# Patient Record
Sex: Male | Born: 1965
Health system: Southern US, Community
[De-identification: ages and names within clinical notes are randomized; demographics above are authoritative.]

## PROBLEM LIST (undated history)

## (undated) DIAGNOSIS — M1990 Unspecified osteoarthritis, unspecified site: Secondary | ICD-10-CM

## (undated) DIAGNOSIS — T7840XA Allergy, unspecified, initial encounter: Secondary | ICD-10-CM

## (undated) DIAGNOSIS — I10 Essential (primary) hypertension: Secondary | ICD-10-CM

## (undated) HISTORY — DX: Essential (primary) hypertension: I10

## (undated) HISTORY — DX: Unspecified osteoarthritis, unspecified site: M19.90

## (undated) HISTORY — DX: Allergy, unspecified, initial encounter: T78.40XA

---

## 2013-11-03 ENCOUNTER — Emergency Department: Payer: Self-pay | Admitting: Emergency Medicine

## 2015-07-04 ENCOUNTER — Ambulatory Visit: Payer: Self-pay | Admitting: Family Medicine

## 2015-07-12 ENCOUNTER — Ambulatory Visit (INDEPENDENT_AMBULATORY_CARE_PROVIDER_SITE_OTHER): Payer: Self-pay | Admitting: Family Medicine

## 2015-07-12 ENCOUNTER — Encounter: Payer: Self-pay | Admitting: Family Medicine

## 2015-07-12 ENCOUNTER — Ambulatory Visit: Payer: Self-pay | Admitting: Family Medicine

## 2015-07-12 VITALS — BP 140/98 | HR 89 | Temp 98.3°F | Resp 16 | Ht 68.0 in | Wt 180.0 lb

## 2015-07-12 DIAGNOSIS — E785 Hyperlipidemia, unspecified: Secondary | ICD-10-CM

## 2015-07-12 DIAGNOSIS — S39011A Strain of muscle, fascia and tendon of abdomen, initial encounter: Secondary | ICD-10-CM | POA: Diagnosis not present

## 2015-07-12 DIAGNOSIS — Z7189 Other specified counseling: Secondary | ICD-10-CM

## 2015-07-12 DIAGNOSIS — Z1211 Encounter for screening for malignant neoplasm of colon: Secondary | ICD-10-CM

## 2015-07-12 DIAGNOSIS — Z125 Encounter for screening for malignant neoplasm of prostate: Secondary | ICD-10-CM | POA: Diagnosis not present

## 2015-07-12 DIAGNOSIS — I10 Essential (primary) hypertension: Secondary | ICD-10-CM | POA: Insufficient documentation

## 2015-07-12 DIAGNOSIS — Z7689 Persons encountering health services in other specified circumstances: Secondary | ICD-10-CM

## 2015-07-12 DIAGNOSIS — Z720 Tobacco use: Secondary | ICD-10-CM

## 2015-07-12 NOTE — Assessment & Plan Note (Signed)
Previously diet controlled. Will check at home x2 weeks and plan to restart medication if still high. Discussed diet and lifestyle changes. Check CMET.

## 2015-07-12 NOTE — Patient Instructions (Signed)
I think you strained a muscle in your groin. I do not think there is a hernia present. Avoid straining and heavy lifting if possible for a few weeks. Use heat and ice as needed for pain. Take aleve or advil as needed for pain.  If the pain is not getting better we will get an ultrasound.   Check your blood pressure at work 2-3 times per week and write the number down.  I want to see you back in 2-3 weeks to check your blood pressure. Your goal blood pressure is 140/90. Work on low salt/sodium diet - goal <1.5gm (1,500mg ) per day. Eat a diet high in fruits/vegetables and whole grains.  Look into mediterranean and DASH diet. Goal activity is 13150min/wk of moderate intensity exercise.  This can be split into 30 minute chunks.  If you are not at this level, you can start with smaller 10-15 min increments and slowly build up activity. Look at www.heart.org for more resources Please seek immediate medical attention at ER or Urgent Care if you develop: Chest pain, pressure or tightness. Shortness of breath accompanied by nausea or diaphoresis Visual changes Numbness or tingling on one side of the body Facial droop Altered mental status Or any concerning symptoms.

## 2015-07-12 NOTE — Assessment & Plan Note (Signed)
Pt feels ready to quit. 5 minutes of smoking cessation counseling given today. Referred to [facility] for smoking cessation resources.

## 2015-07-12 NOTE — Progress Notes (Signed)
Subjective:    Patient ID: Ryan Bean, male    DOB: [birth year], 50 y.o.   MRN: [id number]  HPI: Ryan Bean is a 50 y.o. male presenting on 07/12/2015 for Establish Care   HPI  Pt presents to establish care today. Previous care provider was Dr. Lahoma RockerPancaldo.  It has been 4 years since His last PCP visit. Records from previous provider will be requested and reviewed. Current medical problems include:  High Blood Pressure: Diagnosed 4 years ago. Was previously on a diuretic. Diet controlled his BP. Has stated it was high past few times it was checked. No CP, SOB, or feeling faint. No swelling in feet or legs.  Arthritis: R elbow- managed OTC tylenol and aleve. Occasionally bothers him. Allergies: Seasonal. Pollen. Takes OTC zyrtec as needed. Does not use flonase.  Groin pain when he exercises- does lots of crunches. Feels pressure in groin when he raises up. Has not noticed a lump or a bulge. Symptoms present x 1 month. Only painful when he does push-ups.  Health maintenance:  No colonoscopy- as yet. Does not want. Desires cologuard. No family history of prostate. Desires to PSA.  Flu shot- declines.  Current smoker- 1 pack per 3 days. Wants to quit. Feels ready.    Past Medical History  Diagnosis Date  . Allergy   . Arthritis   . Hypertension    Social History   Social History  . Marital Status: Single    Spouse Name: N/A  . Number of Children: N/A  . Years of Education: N/A   Occupational History  . Not on file.   Social History Main Topics  . Smoking status: Current Every Day Smoker -- 1.00 packs/day  . Smokeless tobacco: Not on file     Comment: pt smokes pack in 3 days  . Alcohol Use: 1.2 oz/week    2 Cans of beer per week  . Drug Use: No  . Sexual Activity: Not on file   Other Topics Concern  . Not on file   Social History Narrative  . No narrative on file   No family history on file. No current outpatient prescriptions on file prior to  visit.   No current facility-administered medications on file prior to visit.    Review of Systems  Constitutional: Negative for fever and chills.  HENT: Positive for postnasal drip and rhinorrhea.   Eyes: Positive for itching (allergy related).  Respiratory: Negative for chest tightness, shortness of breath and wheezing.   Cardiovascular: Negative for chest pain, palpitations and leg swelling.  Gastrointestinal: Negative for nausea, vomiting and abdominal pain.  Endocrine: Negative.   Genitourinary: Negative for dysuria, urgency, discharge, penile pain and testicular pain.  Musculoskeletal: Negative for back pain, joint swelling and arthralgias.       Groin pain with straining.  Skin: Negative.   Allergic/Immunologic: Positive for environmental allergies.  Neurological: Negative for dizziness, weakness, numbness and headaches.  Psychiatric/Behavioral: Negative for sleep disturbance and dysphoric mood.   Per HPI unless specifically indicated above     Objective:    BP 140/98 mmHg  Pulse 89  Temp(Src) 98.3 F (36.8 C) (Oral)  Resp 16  Ht 5\' 8"  (1.727 m)  Wt 180 lb (81.647 kg)  BMI 27.38 kg/m2  Wt Readings from Last 3 Encounters:  07/12/15 180 lb (81.647 kg)    Physical Exam  Constitutional: He is oriented to person, place, and time. He appears well-developed and well-nourished. No distress.  HENT:  Head:  Normocephalic and atraumatic.  Neck: Neck supple. No thyromegaly present.  Cardiovascular: Normal rate, regular rhythm and normal heart sounds.  Exam reveals no gallop and no friction rub.   No murmur heard. Pulmonary/Chest: Effort normal and breath sounds normal. He has no wheezes.  Abdominal: Soft. Bowel sounds are normal. He exhibits no distension. There is no tenderness. There is no rebound. Hernia confirmed negative in the right inguinal area and confirmed negative in the left inguinal area.  Genitourinary: Penis normal.  Musculoskeletal: Normal range of motion. He  exhibits no edema or tenderness.  Lymphadenopathy:       Right: No inguinal adenopathy present.       Left: No inguinal adenopathy present.  Neurological: He is alert and oriented to person, place, and time. He has normal reflexes.  Skin: Skin is warm and dry. No rash noted. No erythema.  Psychiatric: He has a normal mood and affect. His behavior is normal. Thought content normal.   No results found for this or any previous visit.    Assessment & Plan:   Problem List Items Addressed This Visit      Cardiovascular and Mediastinum   HTN (hypertension)    Previously diet controlled. Will check at home x2 weeks and plan to restart medication if still high. Discussed diet and lifestyle changes. Check CMET.       Relevant Medications   omega-3 acid ethyl esters (LOVAZA) 1 g capsule   Other Relevant Orders   Comprehensive Metabolic Panel (CMET)     Other   Current tobacco use    Pt feels ready to quit. 5 minutes of smoking cessation counseling given today. Referred to  Quitline for smoking cessation resources.        Other Visit Diagnoses    Encounter to establish care    -  Primary    Mild hyperlipidemia        Told in the past. Recheck lipids today.     Relevant Medications    omega-3 acid ethyl esters (LOVAZA) 1 g capsule    Other Relevant Orders    Lipid Profile    Prostate cancer screening        Reviewed risks and benefits. Pt would like to screen PSA.     Relevant Orders    PSA    Colon cancer screening        Pt declined colonoscopy. Has consented to do cologuard.     Relevant Orders    Cologuard    Strain of groin, initial encounter        Hernia tests negative. Likley MSK strain. Aleve and rest. If symptoms still present, consider [place] to r/o hernia.        Meds ordered this encounter  Medications  . omega-3 acid ethyl esters (LOVAZA) 1 g capsule    Sig: Take by mouth daily. Pt takes 350 mg Mega Red fish oil      Follow up plan: Return in about 3 weeks  (around 08/02/2015) for BP check.

## 2015-07-14 LAB — LIPID PANEL
CHOLESTEROL TOTAL: 249 mg/dL — AB (ref 100–199)
Chol/HDL Ratio: 8 ratio units — ABNORMAL HIGH (ref 0.0–5.0)
HDL: 31 mg/dL — ABNORMAL LOW (ref >39)
Triglycerides: 1461 mg/dL (ref 0–149)

## 2015-07-14 LAB — COMPREHENSIVE METABOLIC PANEL
ALBUMIN: 4.3 g/dL (ref 3.5–5.5)
ALK PHOS: 73 IU/L (ref 39–117)
ALT: 70 IU/L — AB (ref 0–44)
AST: 38 IU/L (ref 0–40)
Albumin/Globulin Ratio: 1.9 (ref 1.2–2.2)
BILIRUBIN TOTAL: 0.4 mg/dL (ref 0.0–1.2)
BUN / CREAT RATIO: 25 — AB (ref 9–20)
BUN: 17 mg/dL (ref 6–24)
CHLORIDE: 99 mmol/L (ref 96–106)
CO2: 23 mmol/L (ref 18–29)
Calcium: 9.2 mg/dL (ref 8.7–10.2)
Creatinine, Ser: 0.68 mg/dL — ABNORMAL LOW (ref 0.76–1.27)
GFR calc Af Amer: 129 mL/min/{1.73_m2} (ref >59)
GFR calc non Af Amer: 111 mL/min/{1.73_m2} (ref >59)
GLUCOSE: 94 mg/dL (ref 65–99)
Globulin, Total: 2.3 g/dL (ref 1.5–4.5)
POTASSIUM: 4 mmol/L (ref 3.5–5.2)
Sodium: 137 mmol/L (ref 134–144)
Total Protein: 6.6 g/dL (ref 6.0–8.5)

## 2015-07-14 LAB — PSA: Prostate Specific Ag, Serum: 0.5 ng/mL (ref 0.0–4.0)

## 2015-07-17 ENCOUNTER — Other Ambulatory Visit: Payer: Self-pay | Admitting: Family Medicine

## 2015-07-17 DIAGNOSIS — E782 Mixed hyperlipidemia: Secondary | ICD-10-CM

## 2015-08-02 ENCOUNTER — Ambulatory Visit: Payer: BLUE CROSS/BLUE SHIELD | Admitting: Family Medicine

## 2015-08-03 ENCOUNTER — Ambulatory Visit (INDEPENDENT_AMBULATORY_CARE_PROVIDER_SITE_OTHER): Payer: BLUE CROSS/BLUE SHIELD | Admitting: Family Medicine

## 2015-08-03 ENCOUNTER — Telehealth: Payer: Self-pay | Admitting: *Deleted

## 2015-08-03 VITALS — BP 144/94 | HR 92 | Temp 98.4°F | Resp 16 | Ht 68.0 in | Wt 178.0 lb

## 2015-08-03 DIAGNOSIS — I10 Essential (primary) hypertension: Secondary | ICD-10-CM

## 2015-08-03 DIAGNOSIS — R1032 Left lower quadrant pain: Secondary | ICD-10-CM

## 2015-08-03 DIAGNOSIS — K625 Hemorrhage of anus and rectum: Secondary | ICD-10-CM | POA: Diagnosis not present

## 2015-08-03 DIAGNOSIS — R42 Dizziness and giddiness: Secondary | ICD-10-CM

## 2015-08-03 DIAGNOSIS — E782 Mixed hyperlipidemia: Secondary | ICD-10-CM | POA: Diagnosis not present

## 2015-08-03 DIAGNOSIS — E7849 Other hyperlipidemia: Secondary | ICD-10-CM | POA: Insufficient documentation

## 2015-08-03 DIAGNOSIS — Z1211 Encounter for screening for malignant neoplasm of colon: Secondary | ICD-10-CM

## 2015-08-03 DIAGNOSIS — R1030 Lower abdominal pain, unspecified: Secondary | ICD-10-CM

## 2015-08-03 MED ORDER — HYDROCHLOROTHIAZIDE 12.5 MG PO TABS: 12.5000 mg | ORAL_TABLET | Freq: Every day | ORAL | Status: DC

## 2015-08-03 NOTE — Progress Notes (Signed)
Subjective:    Patient ID: Ryan Bean, male    DOB: [birth year], 50 y.o.   MRN: [id number]  HPI: Ryan Bean is a 50 y.o. male presenting on 08/03/2015 for Hypertension   HPI   Here for BP check up. BP remains elevated.150-160's/100's when he's checked at home. Just had fasting lipid panel drawn. Had an episode of lightheadedness with pre-syncope. This happened once 2 weeks ago after doing a lot of yardwork. Did not drink a lot o of water that day. Resolved.  Also had one occurrence of left arm "pressure", but he was able to move. No numbness or tingling. This happened 2 weeks ago. Could have been related to working in the yard he says. He is ambidextrous. No HA. No CP/SOB. No swelling.  He was on BP meds in the past, but taken off once he lost weight. Believes it was HCTZ. Still smokes, but has cut down. Yesterday smoked only 2 cigarettes.   Groin pain on left side is slightly better, but still there - would like [place].  Cologuard - plans on turning in next week. Had some blood in stool he thought was related to hemorrhoids and read that he should not donate stool with blood. Will refer for colonoscopy   Past Medical History  Diagnosis Date  . Allergy   . Arthritis   . Hypertension     Current Outpatient Prescriptions on File Prior to Visit  Medication Sig  . omega-3 acid ethyl esters (LOVAZA) 1 g capsule Take by mouth daily. Pt takes 350 mg Mega Red fish oil   No current facility-administered medications on file prior to visit.    Review of Systems  Constitutional: Negative for activity change and appetite change.  HENT: Negative for congestion and hearing loss.   Eyes: Negative for visual disturbance.  Respiratory: Negative for cough, chest tightness and shortness of breath.   Cardiovascular: Negative for chest pain and leg swelling.  Gastrointestinal: Positive for anal bleeding. Negative for abdominal pain and abdominal distention.  Genitourinary:  Negative for difficulty urinating.  Musculoskeletal: Negative for myalgias and arthralgias.  Skin: Negative for color change.  Neurological: Positive for dizziness and light-headedness. Negative for syncope, weakness and headaches.  Psychiatric/Behavioral: Negative for behavioral problems, sleep disturbance and agitation.   Per HPI unless specifically indicated above     Objective:    BP 144/94 mmHg  Pulse 92  Temp(Src) 98.4 F (36.9 C) (Oral)  Resp 16  Ht  (1.727 m)  Wt 178 lb (80.74 kg)  BMI 27.07 kg/m2  Wt Readings from Last 3 Encounters:  08/03/15 178 lb (80.74 kg)  07/12/15 180 lb (81.647 kg)    Physical Exam  Constitutional: He is oriented to person, place, and time. He appears well-developed and well-nourished.  HENT:  Head: Normocephalic.  Eyes: EOM are normal. Pupils are equal, round, and reactive to light.  Neck: Normal range of motion.  Cardiovascular: Normal rate, regular rhythm, normal heart sounds and normal pulses.   Pulmonary/Chest: Effort normal and breath sounds normal.  Musculoskeletal: Normal range of motion.  Normal strength in BUE and BLE.  Neurological: He is alert and oriented to person, place, and time. He has normal strength. No cranial nerve deficit or sensory deficit. Coordination and gait normal. GCS eye subscore is 4. GCS verbal subscore is 5. GCS motor subscore is 6.  Reflex Scores:      Patellar reflexes are 2+ on the right side and 2+ on the left side.  Cerebellar movements intact.  Skin: Skin is warm and dry.  Psychiatric: He has a normal mood and affect. His behavior is normal.   Results for orders placed or performed in visit on 07/12/15  Comprehensive Metabolic Panel (CMET)  Result Value Ref Range   Glucose 94 65 - 99 mg/dL   BUN 17 6 - 24 mg/dL   Creatinine, Ser 4.090.68 (L) 0.76 - 1.27 mg/dL   GFR calc non Af Amer 111 >59 mL/min/1.73   GFR calc Af Amer 129 >59 mL/min/1.73   BUN/Creatinine Ratio 25 (H) 9 - 20   Sodium 137 134 -  144 mmol/L   Potassium 4.0 3.5 - 5.2 mmol/L   Chloride 99 96 - 106 mmol/L   CO2 23 18 - 29 mmol/L   Calcium 9.2 8.7 - 10.2 mg/dL   Total Protein 6.6 6.0 - 8.5 g/dL   Albumin 4.3 3.5 - 5.5 g/dL   Globulin, Total 2.3 1.5 - 4.5 g/dL   Albumin/Globulin Ratio 1.9 1.2 - 2.2   Bilirubin Total 0.4 0.0 - 1.2 mg/dL   Alkaline Phosphatase 73 39 - 117 IU/L   AST 38 0 - 40 IU/L   ALT 70 (H) 0 - 44 IU/L  Lipid Profile  Result Value Ref Range   Cholesterol, Total 249 (H) 100 - 199 mg/dL   Triglycerides 81191461 (HH) 0 - 149 mg/dL   HDL 31 (L) >14>39 mg/dL   VLDL Cholesterol Cal Comment 5 - 40 mg/dL   LDL Calculated Comment 0 - 99 mg/dL   Chol/HDL Ratio 8.0 (H) 0.0 - 5.0 ratio units  PSA  Result Value Ref Range   Prostate Specific Ag, Serum 0.5 0.0 - 4.0 ng/mL      Assessment & Plan:   Problem List Items Addressed This Visit      Cardiovascular and Mediastinum   HTN (hypertension)    Start on HCTZ today. Reviewed need to remain hydrated. Encouraged pt to check BP at home. Encouraged DASH diet and smoking cessation. Return in 4 weeks.       Relevant Medications   hydrochlorothiazide (HYDRODIURIL) 12.5 MG tablet     Other   Elevated triglycerides with high cholesterol    Recheck fasting lipid panel.       Relevant Medications   hydrochlorothiazide (HYDRODIURIL) 12.5 MG tablet    Other Visit Diagnoses    Lightheadedness    -  Primary    ECG to r/o cardiac cause.ECG WNL. Check CBC. Likley 2/2 dehydration.  Counseled on maintaining hydration when working in yard. Pt to ER if it occurs again.     Relevant Orders    EKG 12-Lead    CBC with Differential/Platelet    TSH    Left groin pain        R/o hernia through US. Continue PRN aleve for pain.     Relevant Orders    US Extrem Low Left Ltd    Screening for colon cancer        Screen with colonoscopy considering recent rectal bleeding.     Relevant Orders    Ambulatory referral to General Surgery    Rectal bleeding           Meds  ordered this encounter  Medications  . hydrochlorothiazide (HYDRODIURIL) 12.5 MG tablet    Sig: Take 1 tablet (12.5 mg total) by mouth daily.    Dispense:  30 tablet    Refill:  11    Order Specific Question:  Supervising Provider  Answer:  Janeann Forehand [[id number]]      Follow up plan: Return in about 4 weeks (around 08/31/2015), or if symptoms worsen or fail to improve, for Blood pressure. Marland Kitchen

## 2015-08-03 NOTE — Addendum Note (Signed)
Addended by: Elvina MattesPATEL, Cheyenne Schumm D on: 08/03/2015 04:34 PM   Modules accepted: Kipp BroodSmartSet

## 2015-08-03 NOTE — Patient Instructions (Signed)
Lightheadedness: I think your symptoms were related to dehydration or not eating since they occurred on a day when you were outside for a long period of time and working. However if they occur in the future- please seek medical attention.  We will schedule and ultrasound to rule out a hernia in your groin.   Your goal blood pressure is 140/90. Work on low salt/sodium diet - goal <1.5gm (1,500mg ) per day. Eat a diet high in fruits/vegetables and whole grains.  Look into mediterranean and DASH diet. Goal activity is 12750min/wk of moderate intensity exercise.  This can be split into 30 minute chunks.  If you are not at this level, you can start with smaller 10-15 min increments and slowly build up activity. Look at www.heart.org for more resources  Please seek immediate medical attention at ER or Urgent Care if you develop: Chest pain, pressure or tightness. Shortness of breath accompanied by nausea or diaphoresis Visual changes Numbness or tingling on one side of the body Facial droop Altered mental status Or any concerning symptoms.

## 2015-08-03 NOTE — Assessment & Plan Note (Signed)
Recheck fasting lipid panel 

## 2015-08-03 NOTE — Telephone Encounter (Signed)
Patient scheduled for u/s on 08/07/15 arrival 3:45 for 4 pm appt. [facility] [address]

## 2015-08-03 NOTE — Assessment & Plan Note (Signed)
Start on HCTZ today. Reviewed need to remain hydrated. Encouraged pt to check BP at home. Encouraged DASH diet and smoking cessation. Return in 4 weeks.

## 2015-08-04 LAB — LIPID PANEL
CHOL/HDL RATIO: 7.2 ratio — AB (ref 0.0–5.0)
Cholesterol, Total: 229 mg/dL — ABNORMAL HIGH (ref 100–199)
HDL: 32 mg/dL — AB (ref >39)
TRIGLYCERIDES: 720 mg/dL — AB (ref 0–149)

## 2015-08-04 LAB — PSA: PROSTATE SPECIFIC AG, SERUM: 0.5 ng/mL (ref 0.0–4.0)

## 2015-08-07 ENCOUNTER — Other Ambulatory Visit: Payer: Self-pay | Admitting: Family Medicine

## 2015-08-07 ENCOUNTER — Ambulatory Visit: Payer: BLUE CROSS/BLUE SHIELD

## 2015-08-07 DIAGNOSIS — E781 Pure hyperglyceridemia: Secondary | ICD-10-CM

## 2015-08-07 MED ORDER — GEMFIBROZIL 600 MG PO TABS: 600.0000 mg | ORAL_TABLET | Freq: Two times a day (BID) | ORAL | Status: DC

## 2015-08-16 ENCOUNTER — Ambulatory Visit: Payer: BLUE CROSS/BLUE SHIELD

## 2015-09-05 ENCOUNTER — Ambulatory Visit: Payer: BLUE CROSS/BLUE SHIELD | Admitting: Family Medicine

## 2015-09-05 ENCOUNTER — Encounter: Payer: Self-pay | Admitting: Family Medicine

## 2015-09-13 ENCOUNTER — Ambulatory Visit (INDEPENDENT_AMBULATORY_CARE_PROVIDER_SITE_OTHER): Payer: BLUE CROSS/BLUE SHIELD | Admitting: Family Medicine

## 2015-09-13 ENCOUNTER — Encounter: Payer: Self-pay | Admitting: Family Medicine

## 2015-09-13 VITALS — BP 136/82 | HR 87 | Temp 98.6°F | Resp 16 | Ht 68.0 in | Wt 174.0 lb

## 2015-09-13 DIAGNOSIS — E782 Mixed hyperlipidemia: Secondary | ICD-10-CM | POA: Diagnosis not present

## 2015-09-13 DIAGNOSIS — Z720 Tobacco use: Secondary | ICD-10-CM

## 2015-09-13 DIAGNOSIS — I10 Essential (primary) hypertension: Secondary | ICD-10-CM | POA: Diagnosis not present

## 2015-09-13 NOTE — Assessment & Plan Note (Signed)
Pt down to 3 cigarettes per day. Encouraged him to quit when he finishes last pack.

## 2015-09-13 NOTE — Assessment & Plan Note (Signed)
Controlled with HCTZ. Recheck in 2 mos. Plan to recheck BMET at that time. Encouraged smoking cessation. Reviewed DASH diet. Alarm symptoms reviewed.

## 2015-09-13 NOTE — Progress Notes (Signed)
Subjective:    Patient ID: Ryan Bean, male    DOB: May 09, 1965, 50 y.o.   MRN: [id number]  HPI: Ryan Bean is a 50 y.o. male presenting on 09/13/2015 for Hypertension   HPI  Pt present for blood pressure follow-up. Started HCTZ 12.mg at last visit. Currently smoking. BP is a little high. 3 cigarettes per day. No HA or chest pain. No SOB. No visual changes. Started lopid last month. Doing.   Past Medical History  Diagnosis Date  . Allergy   . Arthritis   . Hypertension     Current Outpatient Prescriptions on File Prior to Visit  Medication Sig  . gemfibrozil (LOPID) 600 MG tablet Take 1 tablet (600 mg total) by mouth 2 (two) times daily before a meal.  . hydrochlorothiazide (HYDRODIURIL) 12.5 MG tablet Take 1 tablet (12.5 mg total) by mouth daily.  Marland Kitchen. omega-3 acid ethyl esters (LOVAZA) 1 g capsule Take by mouth daily. Pt takes 350 mg Mega Red fish oil   No current facility-administered medications on file prior to visit.    Review of Systems  Constitutional: Negative for fever and chills.  HENT: Negative.   Respiratory: Negative for chest tightness, shortness of breath and wheezing.   Cardiovascular: Negative for chest pain, palpitations and leg swelling.  Gastrointestinal: Negative for nausea, vomiting and abdominal pain.  Endocrine: Negative.   Genitourinary: Negative for dysuria, urgency, discharge, penile pain and testicular pain.  Musculoskeletal: Negative for back pain, joint swelling and arthralgias.  Skin: Negative.   Neurological: Negative for dizziness, weakness, numbness and headaches.  Psychiatric/Behavioral: Negative for sleep disturbance and dysphoric mood.   Per HPI unless specifically indicated above     Objective:    BP 136/82 mmHg  Pulse 87  Temp(Src) 98.6 F (37 C) (Oral)  Resp 16  Ht 5\' 8"  (1.727 m)  Wt 174 lb (78.926 kg)  BMI 26.46 kg/m2  Wt Readings from Last 3 Encounters:  09/13/15 174 lb (78.926 kg)  08/03/15 178 lb  (80.74 kg)  07/12/15 180 lb (81.647 kg)    Physical Exam  Constitutional: He is oriented to person, place, and time. He appears well-developed and well-nourished. No distress.  HENT:  Head: Normocephalic and atraumatic.  Neck: Neck supple. No thyromegaly present.  Cardiovascular: Normal rate, regular rhythm and normal heart sounds.  Exam reveals no gallop and no friction rub.   No murmur heard. Pulmonary/Chest: Effort normal and breath sounds normal. He has no wheezes.  Abdominal: Soft. Bowel sounds are normal. He exhibits no distension. There is no tenderness. There is no rebound.  Musculoskeletal: Normal range of motion. He exhibits no edema or tenderness.  Neurological: He is alert and oriented to person, place, and time. He has normal reflexes.  Skin: Skin is warm and dry. No rash noted. No erythema.  Psychiatric: He has a normal mood and affect. His behavior is normal. Thought content normal.   Results for orders placed or performed in visit on 07/17/15  Lipid panel  Result Value Ref Range   Cholesterol, Total 229 (H) 100 - 199 mg/dL   Triglycerides 914720 (HH) 0 - 149 mg/dL   HDL 32 (L) >78>39 mg/dL   VLDL Cholesterol Cal Comment 5 - 40 mg/dL   LDL Calculated Comment 0 - 99 mg/dL   Chol/HDL Ratio 7.2 (H) 0.0 - 5.0 ratio units  PSA  Result Value Ref Range   Prostate Specific Ag, Serum 0.5 0.0 - 4.0 ng/mL      Assessment &  Plan:   Problem List Items Addressed This Visit      Cardiovascular and Mediastinum   HTN (hypertension)    Controlled with HCTZ. Recheck in 2 mos. Plan to recheck BMET at that time. Encouraged smoking cessation. Reviewed DASH diet. Alarm symptoms reviewed.         Other   Current tobacco use - Primary    Pt down to 3 cigarettes per day. Encouraged him to quit when he finishes last pack.       Elevated triglycerides with high cholesterol    Tolerating lopid. Will recheck TG's at next visit in 2 mos.          No orders of the defined types were  placed in this encounter.      Follow up plan: Return in about 2 months (around 11/13/2015) for HTN, and cholesterol. Marland Kitchen

## 2015-09-13 NOTE — Assessment & Plan Note (Signed)
Tolerating lopid. Will recheck TG's at next visit in 2 mos.

## 2015-09-13 NOTE — Patient Instructions (Addendum)
Your goal blood pressure is 140/90. Work on low salt/sodium diet - goal <1.5gm (1,500mg ) per day. Eat a diet high in fruits/vegetables and whole grains.  Look into mediterranean and DASH diet. Goal activity is 14450min/wk of moderate intensity exercise.  This can be split into 30 minute chunks.  If you are not at this level, you can start with smaller 10-15 min increments and slowly build up activity. Look at www.heart.org for more resources  Please seek immediate medical attention at ER or Urgent Care if you develop: Chest pain, pressure or tightness. Shortness of breath accompanied by nausea or diaphoresis Visual changes Numbness or tingling on one side of the body Facial droop Altered mental status Or any concerning symptoms.  [facility] [employer] [facility]  [facility] [address], [place], [place], [phone]

## 2015-11-09 ENCOUNTER — Ambulatory Visit: Payer: BLUE CROSS/BLUE SHIELD | Admitting: Family Medicine

## 2015-11-16 ENCOUNTER — Ambulatory Visit: Payer: BLUE CROSS/BLUE SHIELD | Admitting: Family Medicine

## 2015-11-24 IMAGING — CR RIGHT ELBOW - COMPLETE 3+ VIEW
1 series · 4 of 4 positions shown · non-contrast
Comparison: None.

CLINICAL DATA: Elbow pain and swelling following injury.

EXAM:
RIGHT ELBOW - COMPLETE 3+ VIEW

[Series 1: lat · 0.17mm/px · 4 of 4 slices shown]
[im 1/4]
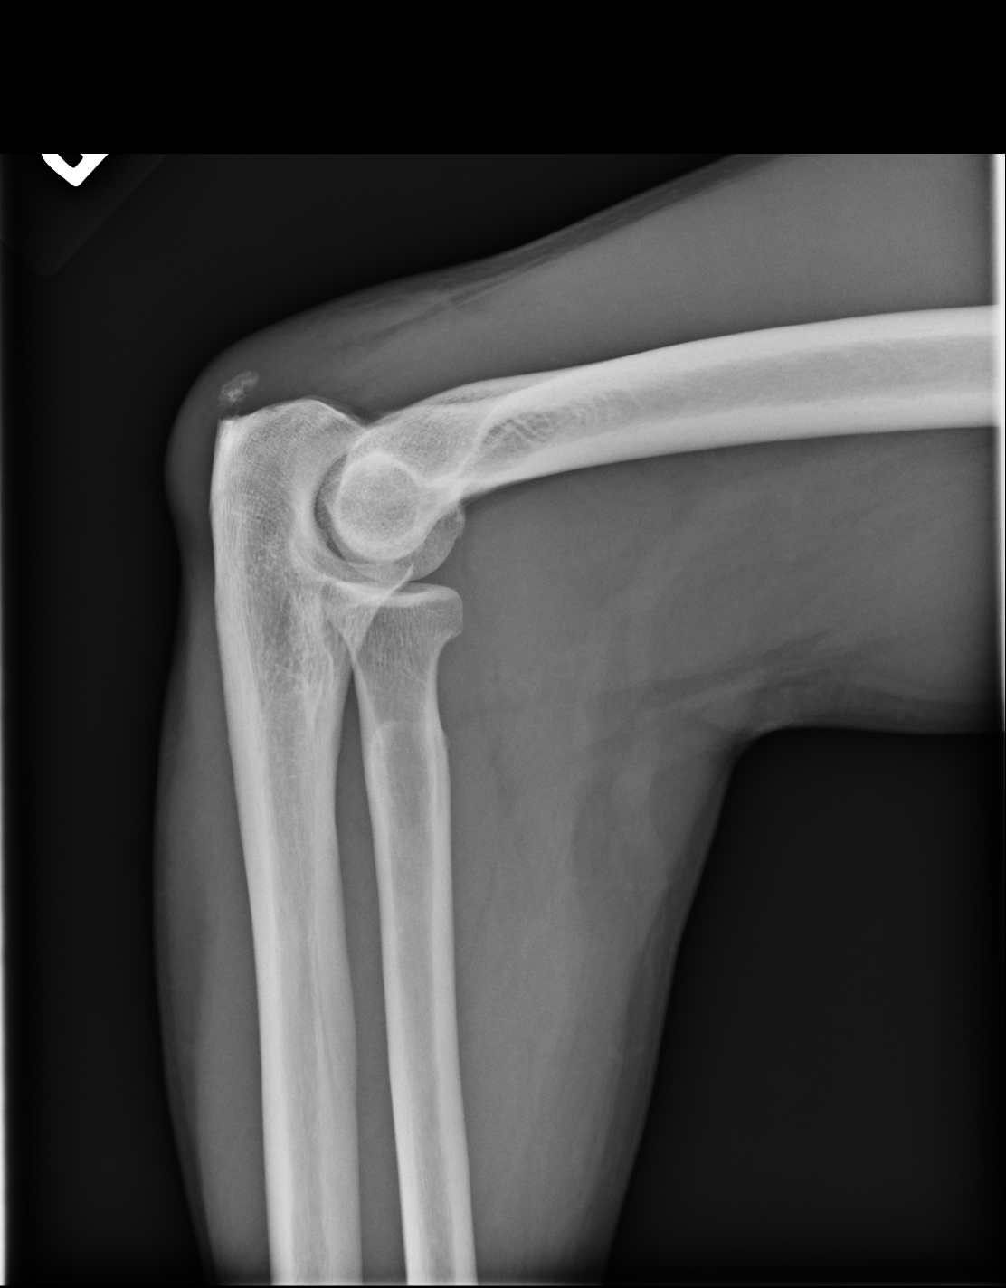
[im 2/4]
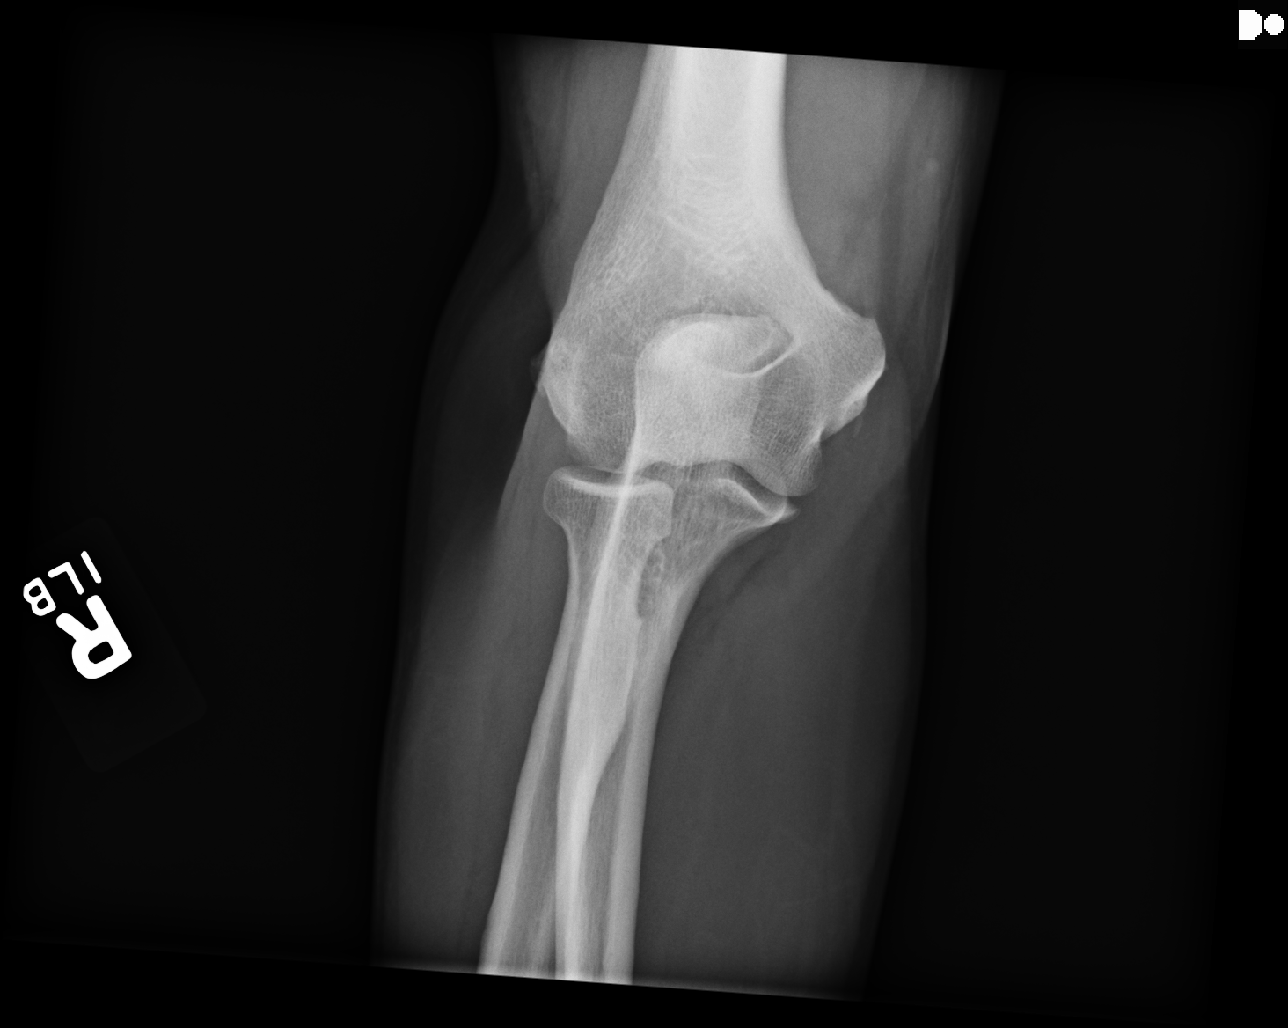
[im 3/4]
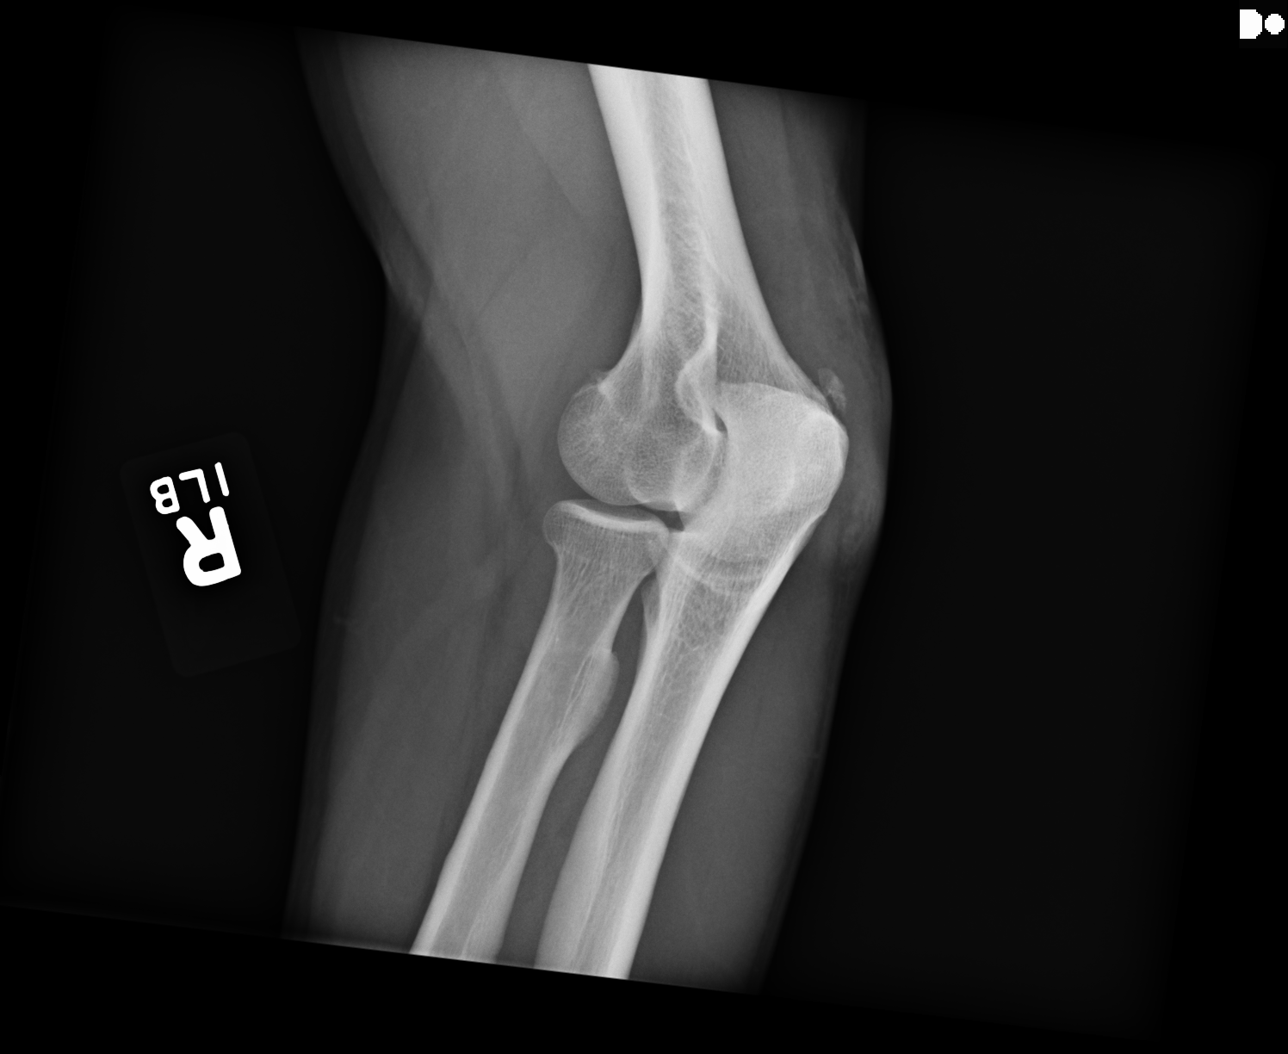
[im 4/4]
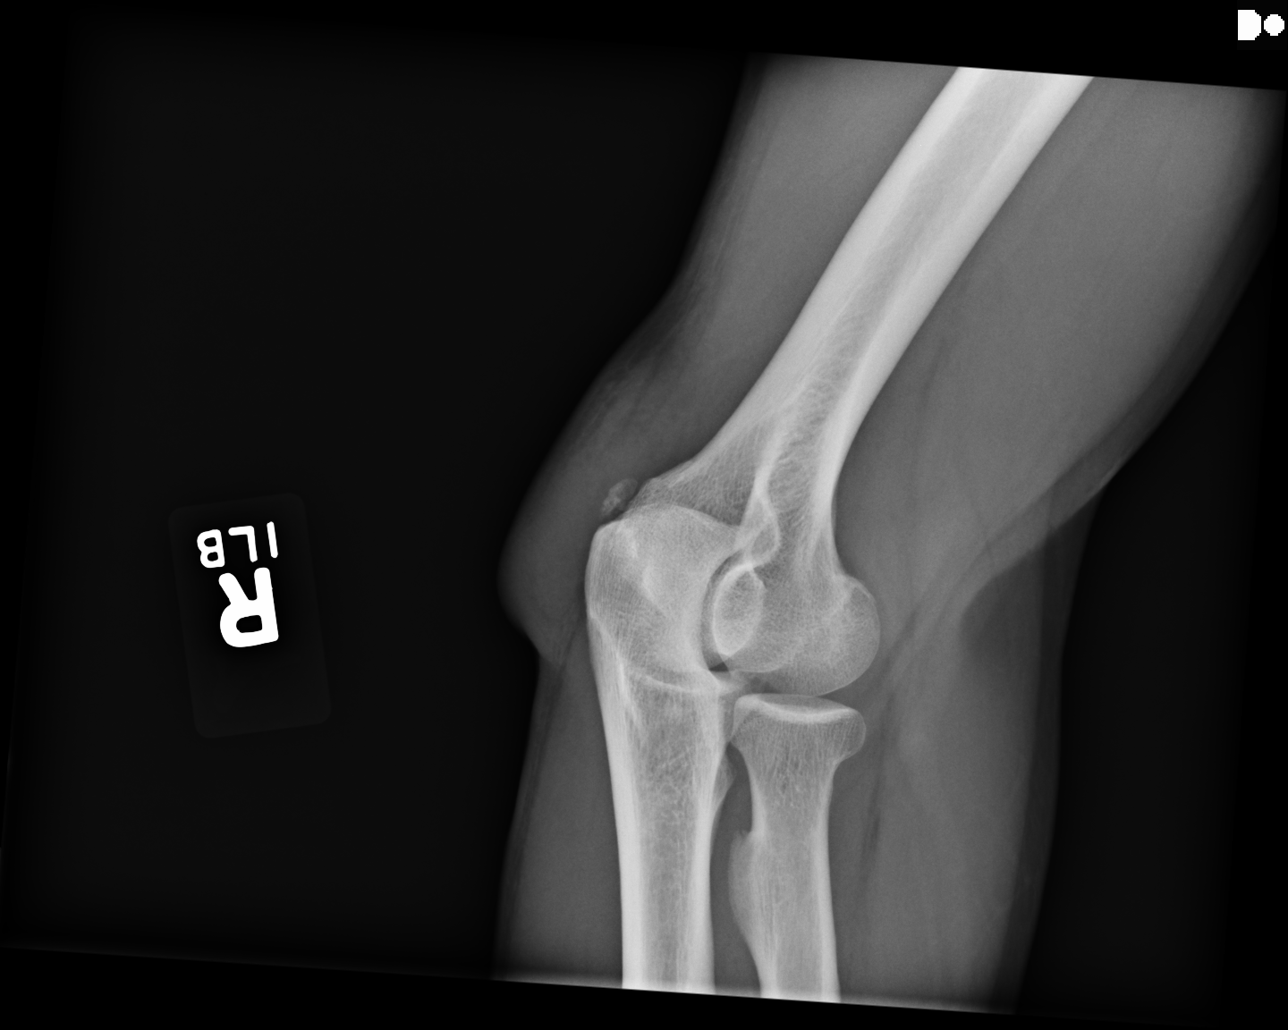

[4 of 4 positions shown; findings below may reference images not displayed]

FINDINGS: The mineralization and alignment are normal. There is prominent
spurring for age involving the olecranon process and both humeral
epicondyles. There is soft tissue swelling over the olecranon
process. An underlying ossific density is most likely a fragmented
spur, and no retraction of the triceps tendon is identified. There
is no evidence of acute fracture, dislocation or elbow joint
effusion.
IMPRESSION: Soft tissue swelling over the olecranon consistent with olecranon
bursitis. There is probable underlying fragmented spurring of the
olecranon. Acute avulsion of the triceps tendon is felt to be
unlikely but requires clinical correlation.

## 2016-02-29 ENCOUNTER — Telehealth: Payer: Self-pay | Admitting: Family Medicine

## 2016-02-29 DIAGNOSIS — I10 Essential (primary) hypertension: Secondary | ICD-10-CM

## 2016-02-29 MED ORDER — HYDROCHLOROTHIAZIDE 12.5 MG PO TABS: 12.5000 mg | ORAL_TABLET | Freq: Every day | ORAL | 11 refills | Status: DC

## 2016-02-29 NOTE — Telephone Encounter (Signed)
HCTZ refill sent to [facility] (not to [facility] [place] [facility])

## 2016-02-29 NOTE — Telephone Encounter (Signed)
Pt needs a refill on hydrochlorothiazide 12.5 mg sent to [facility].  His call back number is [phone]

## 2016-03-12 ENCOUNTER — Telehealth: Payer: Self-pay | Admitting: Family Medicine

## 2016-03-12 NOTE — Telephone Encounter (Signed)
Reviewed chart, I have already submitted refill for HCTZ 12.5mg  daily (#30 pills with +11 refills) on 02/29/16, and he should still have active rx for Gemfibrozil 600mg  twice a day #60 pills (+11 refills) that Amy Krebs sent in back in 07/2015 but should still be active.  Please have patient contact [facility]. If he wants these changed to [facility], then please let me know. Thanks.  Saralyn PilarAlexander Eliezer Khawaja, DO [facility] 03/12/2016, 2:15 PM

## 2016-03-12 NOTE — Telephone Encounter (Signed)
Pt needs refills on gemfibrozil 600 mg and hydrochlorothiazide 12.[facility] mg [facility] [place]  He has appt schedule 12/4.  His call back number is [phone]

## 2016-03-25 ENCOUNTER — Ambulatory Visit: Payer: BLUE CROSS/BLUE SHIELD | Admitting: Family Medicine

## 2016-06-06 ENCOUNTER — Ambulatory Visit (INDEPENDENT_AMBULATORY_CARE_PROVIDER_SITE_OTHER): Payer: BLUE CROSS/BLUE SHIELD | Admitting: Family Medicine

## 2016-06-06 ENCOUNTER — Encounter: Payer: Self-pay | Admitting: Family Medicine

## 2016-06-06 VITALS — BP 144/90 | HR 85 | Temp 97.8°F | Resp 16 | Ht 68.0 in | Wt 186.0 lb

## 2016-06-06 DIAGNOSIS — E782 Mixed hyperlipidemia: Secondary | ICD-10-CM

## 2016-06-06 DIAGNOSIS — Z720 Tobacco use: Secondary | ICD-10-CM

## 2016-06-06 DIAGNOSIS — I10 Essential (primary) hypertension: Secondary | ICD-10-CM | POA: Diagnosis not present

## 2016-06-06 DIAGNOSIS — Z716 Tobacco abuse counseling: Secondary | ICD-10-CM | POA: Diagnosis not present

## 2016-06-06 MED ORDER — HYDROCHLOROTHIAZIDE 12.5 MG PO TABS: 12.5000 mg | ORAL_TABLET | Freq: Every day | ORAL | 0 refills | Status: DC

## 2016-06-06 MED ORDER — HYDROCHLOROTHIAZIDE 12.5 MG PO TABS: 12.5000 mg | ORAL_TABLET | Freq: Every day | ORAL | 11 refills | Status: DC

## 2016-06-06 MED ORDER — OMEGA-3-ACID ETHYL ESTERS 1 G PO CAPS: 1.0000 g | ORAL_CAPSULE | Freq: Two times a day (BID) | ORAL | 0 refills | Status: DC

## 2016-06-06 MED ORDER — OMEGA-3-ACID ETHYL ESTERS 1 G PO CAPS: 1.0000 g | ORAL_CAPSULE | Freq: Two times a day (BID) | ORAL | 11 refills | Status: DC

## 2016-06-06 MED ORDER — GEMFIBROZIL 600 MG PO TABS: 600.0000 mg | ORAL_TABLET | Freq: Two times a day (BID) | ORAL | 0 refills | Status: DC

## 2016-06-06 MED ORDER — GEMFIBROZIL 600 MG PO TABS: 600.0000 mg | ORAL_TABLET | Freq: Two times a day (BID) | ORAL | 11 refills | Status: DC

## 2016-06-06 NOTE — Patient Instructions (Signed)
Thank you for coming in to clinic today.  1. BP is elevated, likely due to not taking med today - [facility], 0 refills  Take printed rx all to [facility] for 1 month supply +11 refills, so 1 year total  2. Check BP at work once weekly, write down readings, for next 8 weeks  Start OTC baby aspirin 81mg  daily to help reduce risk of heart attack and stroke   Let me know if interested in quitting smoking, can discuss medications and other treatment  [phone]  You will be due for FASTING BLOOD WORK (no food or drink after midnight before, only water or coffee without cream/sugar on the morning of)  - Please go ahead and schedule a "Lab Only" visit in the morning at the clinic for lab draw in  2 months AFTER 08/02/16 - Make sure Lab Only appointment is at least 1-2 weeks before your next appointment, so that results will be available  For Lab Results, once available within 2-3 days of blood draw, you can can log in to MyChart online to view your results and a brief explanation. Also, we can discuss results at next follow-up visit.  Please schedule a follow-up appointment with Dr. Althea CharonKaramalegos in 2 months about 1-2 weeks after lab appointment for Annual Physical  If you have any other questions or concerns, please feel free to call the clinic or send a message through MyChart. You may also schedule an earlier appointment if necessary.  Saralyn PilarAlexander Hermina Barnard, DO [facility] [place]

## 2016-06-06 NOTE — Assessment & Plan Note (Signed)
Active smoker, ranging up to 0.5ppd No prior therapy to help quit  Discussion today >5 minutes (<10 minutes) specifically on counseling on risks of tobacco use, complications, treatment, smoking cessation.

## 2016-06-06 NOTE — Progress Notes (Signed)
Subjective:    Patient ID: Ryan Bean, male    DOB: 02/01/1966, 51 y.o.   MRN: [id number]  Ryan Bean is a 51 y.o. male presenting on 06/06/2016 for Hypertension   HPI  CHRONIC HTN: Reports recently elevated BP, has been out of HCTZ for past 2 months, after ran out, was unaware he had additional refills on the rx. Admits initial dx HTN approx age 66s. He can get blood pressure checked at work manually by Statistician. Current Meds - HCTZ 12.5mg  (out of for 2 months) Previously. Tolerating well, w/o complaints. Lifestyle - Exercise at home with push ups and curls every day, tries to limit salt in diet. No caffeine intake. Denies CP, dyspnea, HA, edema, dizziness / lightheadedness  HYPERLIPIDEMIA / WEIGHT GAIN - Reports he has been out of cholesterol medications Gemfribozil and Lovaza for past 2 months. Last lipid panel 08/03/15,  Abnormal with significantly elevated TG 720, previously up to 1400. Month prior before fibrate. Also with low HDL, unable to calculate LDL. - Never on statin medication, and not taking ASA 81 - Admits gradual weight gain + 6-8 lbs in 9 months  TOBACCO ABUSE: - Active smoker, now about 0.5ppd - Previously quit for 8 months cold [place], but admits gained significant weight about 10-20 years ago - Not interested in quitting currently   Social History  Substance Use Topics  . Smoking status: Current Every Day Smoker    Packs/day: 0.50    Years: 30.00  . Smokeless tobacco: Never Used  . Alcohol use 1.2 oz/week    2 Cans of beer per week    Review of Systems Per HPI unless specifically indicated above     Objective:    BP (!) 144/90 (BP Location: Left Arm, Cuff Size: Normal)   Pulse 85   Temp 97.8 F (36.6 C) (Oral)   Resp 16   Ht 5\' 8"  (1.727 m)   Wt 186 lb (84.4 kg)   BMI 28.28 kg/m   Wt Readings from Last 3 Encounters:  06/06/16 186 lb (84.4 kg)  09/13/15 174 lb (78.9 kg)  08/03/15 178 lb (80.7 kg)    Physical  Exam  Constitutional: He appears well-developed and well-nourished. No distress.  Well-appearing, comfortable, cooperative  HENT:  Head: Normocephalic.  Mouth/Throat: Oropharynx is clear and moist.  Eyes: Conjunctivae are normal.  Neck: Normal range of motion. Neck supple. No thyromegaly present.  No carotid bruits  Cardiovascular: Normal rate, regular rhythm, normal heart sounds and intact distal pulses.   Pulmonary/Chest: Effort normal and breath sounds normal. No respiratory distress. He has no wheezes. He has no rales.  Good air movement.  Lymphadenopathy:    He has no cervical adenopathy.  Neurological: He is alert.  Skin: Skin is warm and dry. He is not diaphoretic.  Psychiatric: His behavior is normal.  Nursing note and vitals reviewed.  I have personally reviewed the following lab results from 06/2015.  Results for orders placed or performed in visit on 07/17/15  Lipid panel  Result Value Ref Range   Cholesterol, Total 229 (H) 100 - 199 mg/dL   Triglycerides 409 (HH) 0 - 149 mg/dL   HDL 32 (L) >81 mg/dL   VLDL Cholesterol Cal Comment 5 - 40 mg/dL   LDL Calculated Comment 0 - 99 mg/dL   Chol/HDL Ratio 7.2 (H) 0.0 - 5.0 ratio units  PSA  Result Value Ref Range   Prostate Specific Ag, Serum 0.5 0.0 - 4.0 ng/mL  CMP Latest Ref Rng & Units 07/13/2015  Glucose 65 - 99 mg/dL 94  BUN 6 - 24 mg/dL 17  Creatinine 1.190.76 - 1.471.27 mg/dL 8.29(F0.68(L)  Sodium 621134 - 308144 mmol/L 137  Potassium 3.5 - 5.2 mmol/L 4.0  Chloride 96 - 106 mmol/L 99  CO2 18 - 29 mmol/L 23  Calcium 8.7 - 10.2 mg/dL 9.2  Total Protein 6.0 - 8.5 g/dL 6.6  Total Bilirubin 0.0 - 1.2 mg/dL 0.4  Alkaline Phos 39 - 117 IU/L 73  AST 0 - 40 IU/L 38  ALT 0 - 44 IU/L 70(H)      Assessment & Plan:   Problem List Items Addressed This Visit    HTN (hypertension) - Primary    Elevated BP today initially, some improvement on re-check still >140/90 manually, however off of anti-HTN therapy for 2 months. Also active  smoker.  Plan: 1. Refilled HCTZ 12.5mg  daily, [facility], then printed rx for #30 +11 refills for all meds to be given to patient preference [facility] 2. Check outside BP 1-2 x weekly at work by Statisticianemployee nurse, bring BP log to next visit, determine if dose HCTZ need inc to 25 or add 2nd BP medication, discussed options today 3. Counseling on smoking cessation, not ready to quit, quitline given 4. Encouraged continue low sodium diet, and improve regular exercise with more cardio, walking 5. Advised may start OTC ASA 81mg  daily if interested for prevention MI/CVA with age 87>50 with HTN and abnormal lipids 6. Follow-up 2 months for Annual Physical, labs, BP check      Relevant Medications   omega-3 acid ethyl esters (LOVAZA) 1 g capsule   hydrochlorothiazide (HYDRODIURIL) 12.5 MG tablet   gemfibrozil (LOPID) 600 MG tablet   Other Relevant Orders   COMPLETE METABOLIC PANEL WITH GFR   Hemoglobin A1c   Elevated triglycerides with high cholesterol    Elevated TG and low HDL, dyslipidemia, previously with up to >1400 TG 1 year ago, repeat 1 month later on fibrate down to 700s. Without known complication. Never on statin previously  Plan: 1. Refilled Lovaza inc to BID, and Fibrate refilled, local pharmacy for 30 day then printed rx for [facility] with +11 refills 2. Check future fasting labs and lipids 2 months for Annual Physical, discussed likely will need statin therapy at that time, will calculate ASCVD risk 3. Follow-up 2 months      Relevant Medications   omega-3 acid ethyl esters (LOVAZA) 1 g capsule   hydrochlorothiazide (HYDRODIURIL) 12.5 MG tablet   gemfibrozil (LOPID) 600 MG tablet   Other Relevant Orders   Lipid panel   Current tobacco use    Active smoker, ranging up to 0.5ppd No prior therapy to help quit  Discussion today >5 minutes (<10 minutes) specifically on counseling on risks of tobacco use, complications, treatment,  smoking cessation.         Meds ordered this encounter  Medications  . DISCONTD: hydrochlorothiazide (HYDRODIURIL) 12.5 MG tablet    Sig: Take 1 tablet (12.5 mg total) by mouth daily.    Dispense:  30 tablet    Refill:  0  . DISCONTD: gemfibrozil (LOPID) 600 MG tablet    Sig: Take 1 tablet (600 mg total) by mouth 2 (two) times daily before a meal.    Dispense:  60 tablet    Refill:  0  . DISCONTD: omega-3 acid ethyl esters (LOVAZA) 1 g capsule  Sig: Take 1 capsule (1 g total) by mouth 2 (two) times daily.    Dispense:  60 capsule    Refill:  0  . omega-3 acid ethyl esters (LOVAZA) 1 g capsule    Sig: Take 1 capsule (1 g total) by mouth 2 (two) times daily.    Dispense:  60 capsule    Refill:  11  . hydrochlorothiazide (HYDRODIURIL) 12.5 MG tablet    Sig: Take 1 tablet (12.5 mg total) by mouth daily.    Dispense:  30 tablet    Refill:  11  . gemfibrozil (LOPID) 600 MG tablet    Sig: Take 1 tablet (600 mg total) by mouth 2 (two) times daily before a meal.    Dispense:  60 tablet    Refill:  11      Follow up plan: Return in about 2 months (around 08/04/2016) for Annual Physical.  Saralyn Pilar, DO [facility] 06/06/2016, 1:09 PM

## 2016-06-06 NOTE — Assessment & Plan Note (Signed)
Elevated TG and low HDL, dyslipidemia, previously with up to >1400 TG 1 year ago, repeat 1 month later on fibrate down to 700s. Without known complication. Never on statin previously  Plan: 1. Refilled Lovaza inc to BID, and Fibrate refilled, local pharmacy for 30 day then printed rx for [facility] with +11 refills 2. Check future fasting labs and lipids 2 months for Annual Physical, discussed likely will need statin therapy at that time, will calculate ASCVD risk 3. Follow-up 2 months

## 2016-06-06 NOTE — Assessment & Plan Note (Addendum)
Elevated BP today initially, some improvement on re-check still >140/90 manually, however off of anti-HTN therapy for 2 months. Also active smoker.  Plan: 1. Refilled HCTZ 12.5mg  daily, [facility], then printed rx for #30 +11 refills for all meds to be given to patient preference [facility] 2. Check outside BP 1-2 x weekly at work by Statisticianemployee nurse, bring BP log to next visit, determine if dose HCTZ need inc to 25 or add 2nd BP medication, discussed options today 3. Counseling on smoking cessation, not ready to quit, quitline given 4. Encouraged continue low sodium diet, and improve regular exercise with more cardio, walking 5. Advised may start OTC ASA 81mg  daily if interested for prevention MI/CVA with age 52>50 with HTN and abnormal lipids 6. Follow-up 2 months for Annual Physical, labs, BP check

## 2016-07-23 ENCOUNTER — Other Ambulatory Visit: Payer: Self-pay | Admitting: *Deleted

## 2016-07-23 DIAGNOSIS — E782 Mixed hyperlipidemia: Secondary | ICD-10-CM

## 2016-07-23 DIAGNOSIS — I10 Essential (primary) hypertension: Secondary | ICD-10-CM

## 2016-08-06 ENCOUNTER — Other Ambulatory Visit: Payer: BLUE CROSS/BLUE SHIELD

## 2016-08-21 ENCOUNTER — Encounter: Payer: BLUE CROSS/BLUE SHIELD | Admitting: Family Medicine

## 2016-09-05 ENCOUNTER — Encounter: Payer: BLUE CROSS/BLUE SHIELD | Admitting: Family Medicine

## 2016-12-10 LAB — BASIC METABOLIC PANEL: Glucose: 103

## 2016-12-10 LAB — LIPID PANEL
CHOLESTEROL: 239 — AB (ref 0–200)
HDL: 37 (ref 35–70)
TRIGLYCERIDES: 500 — AB (ref 40–160)

## 2016-12-11 ENCOUNTER — Ambulatory Visit (INDEPENDENT_AMBULATORY_CARE_PROVIDER_SITE_OTHER): Payer: BLUE CROSS/BLUE SHIELD | Admitting: Family Medicine

## 2016-12-11 ENCOUNTER — Encounter: Payer: Self-pay | Admitting: Family Medicine

## 2016-12-11 ENCOUNTER — Other Ambulatory Visit: Payer: Self-pay | Admitting: Family Medicine

## 2016-12-11 VITALS — BP 160/100 | HR 98 | Temp 98.4°F | Resp 16 | Ht 68.0 in | Wt 177.0 lb

## 2016-12-11 DIAGNOSIS — I10 Essential (primary) hypertension: Secondary | ICD-10-CM

## 2016-12-11 DIAGNOSIS — Z125 Encounter for screening for malignant neoplasm of prostate: Secondary | ICD-10-CM

## 2016-12-11 DIAGNOSIS — E782 Mixed hyperlipidemia: Secondary | ICD-10-CM | POA: Diagnosis not present

## 2016-12-11 DIAGNOSIS — Z0000 Encounter for general adult medical examination without abnormal findings: Secondary | ICD-10-CM

## 2016-12-11 DIAGNOSIS — Z720 Tobacco use: Secondary | ICD-10-CM

## 2016-12-11 DIAGNOSIS — R799 Abnormal finding of blood chemistry, unspecified: Secondary | ICD-10-CM

## 2016-12-11 DIAGNOSIS — R7989 Other specified abnormal findings of blood chemistry: Secondary | ICD-10-CM

## 2016-12-11 DIAGNOSIS — R945 Abnormal results of liver function studies: Secondary | ICD-10-CM

## 2016-12-11 MED ORDER — AMLODIPINE BESYLATE 5 MG PO TABS: 5.0000 mg | ORAL_TABLET | Freq: Every day | ORAL | 11 refills | Status: DC

## 2016-12-11 NOTE — Assessment & Plan Note (Signed)
Reduced to 3 cigs daily Smoking cessation to quit improve BP

## 2016-12-11 NOTE — Assessment & Plan Note (Signed)
Recommend to take cholesterol meds as prescribed, he was non adherent Follow-up labs in 6 weeks

## 2016-12-11 NOTE — Assessment & Plan Note (Addendum)
Still persistent elevated BP, uncontrolled HTN, non adherent to medications, now taking HCTZ 2x weekly only - Home BP readings none, unable to get checked at work, no cuff at home  - Concern with HTN related HA, without any persistent clinical symptoms today No known complications  Plan:  1. Add new HTN med today, Start Amlodipine 5mg  daily 2. Continue HCTZ 12.5mg  - did not raise dose today due to concerns about hydration status 3. Encourage improved lifestyle - smoking cessation (seems to be improving not quit yet though), low sodium diet, regular exercise 3. Start monitor BP outside office, bring readings to next visit, if persistently >140/90 or new symptoms notify office sooner 4. Follow-up 1 day for nurse BP check, if BP not improved may give advice to take TWO Amlodipine 5mg  pills for dose of 10mg  in about 3 days if BP still remains elevated, but may need chance for new medicine to work. He will return in 6 weeks for additional blood work and Annual Physical further manage HTN

## 2016-12-11 NOTE — Progress Notes (Signed)
Subjective:    Patient ID: Ryan Bean, male    DOB: 1966/02/27, 51 y.o.   MRN: [id number]  Ryan Bean is a 51 y.o. male presenting on 12/11/2016 for Hypertension (as per pt onset 2 week exhausted and at work was dizzy with blurry vision yesterday the checked blood pressure was 168/120.)   HPI  CHRONIC HTN: - Last visit with me 05/2016, for initial visit for BP, treated with resuming HCTZ 12.5mg  he had been off of it for 2 months, see prior notes for background information. - Interval update with only intermittent medication use taking it twice weekly, non adherence, and did not follow-up with blood work as ordered last time. Has not been monitoring BP at office, states nurse there was "fired", does not have cuff - Today patient reports new concern with admits one episode of dizziness blurry vision yesterday with some headache pressure, and also for past 2 weeks had some "sluggishness" but denies exertional chest pain or dyspnea. He attributes some of this to working out in the heat Current Meds - HCTZ 12.5mg  2x weekly, no other meds Previously. Tolerating well, w/o complaints. Lifestyle - Exercise at home with push ups and curls every day, tries to limit salt in diet. No caffeine intake. Admits headache, transient numbness in left forearm and lower leg now resolved, some slight tingling bilateral temples face Denies CP, dyspnea, edema, dizziness / lightheadedness  HYPERLIPIDEMIA - Additionally he has history of HyperTG in past, recent lab screening for biometric at work showed similar hyperTG >500 unable to calc LDL. He has been non adherent on Gemfibrozil and Lovaza, only taking fibrate 2x weekly, no longer taking fish oil, stated it "wasn't helping" - Never started baby Aspirin 81mg , he thinks he tried 325mg  could not tolerate, will try to get right aspirin in future  TOBACCO ABUSE: - Active smoker, however improved since last visit reducing amount from 0.5ppd down to  3 cigs daily - Not interested in quitting currently   Social History  Substance Use Topics  . Smoking status: Current Every Day Smoker    Packs/day: 0.50    Years: 30.00  . Smokeless tobacco: Current User  . Alcohol use 1.2 oz/week    2 Cans of beer per week    Review of Systems Per HPI unless specifically indicated above     Objective:    BP (!) 160/100 (BP Location: Right Arm, Cuff Size: Normal)   Pulse 98   Temp 98.4 F (36.9 C) (Oral)   Resp 16   Ht 5\' 8"  (1.727 m)   Wt 177 lb (80.3 kg)   BMI 26.91 kg/m   Wt Readings from Last 3 Encounters:  12/11/16 177 lb (80.3 kg)  06/06/16 186 lb (84.4 kg)  09/13/15 174 lb (78.9 kg)    Physical Exam  Constitutional: He is oriented to person, place, and time. He appears well-developed and well-nourished. No distress.  Well-appearing, comfortable, cooperative  HENT:  Head: Normocephalic and atraumatic.  Mouth/Throat: Oropharynx is clear and moist.  Eyes: Conjunctivae are normal. Right eye exhibits no discharge. Left eye exhibits no discharge.  Neck: Normal range of motion. Neck supple. No thyromegaly present.  No carotid bruits  Cardiovascular: Normal rate, regular rhythm, normal heart sounds and intact distal pulses.   No murmur heard. Pulmonary/Chest: Effort normal and breath sounds normal. No respiratory distress. He has no wheezes. He has no rales.  Good air movement.  Musculoskeletal: Normal range of motion. He exhibits no edema.  Distal muscle strength intact  Lymphadenopathy:    He has no cervical adenopathy.  Neurological: He is alert and oriented to person, place, and time. No cranial nerve deficit.  Distal sensation to light touch intact  Skin: Skin is warm and dry. No rash noted. He is not diaphoretic. No erythema.  Psychiatric: He has a normal mood and affect. His behavior is normal.  Well groomed, good eye contact, normal speech and thoughts  Nursing note and vitals reviewed.  I have personally reviewed the  following lab results from 06/2015.  Results for orders placed or performed in visit on 07/17/15  Lipid panel  Result Value Ref Range   Cholesterol, Total 229 (H) 100 - 199 mg/dL   Triglycerides 161 (HH) 0 - 149 mg/dL   HDL 32 (L) >09 mg/dL   VLDL Cholesterol Cal Comment 5 - 40 mg/dL   LDL Calculated Comment 0 - 99 mg/dL   Chol/HDL Ratio 7.2 (H) 0.0 - 5.0 ratio units  PSA  Result Value Ref Range   Prostate Specific Ag, Serum 0.5 0.0 - 4.0 ng/mL   CMP Latest Ref Rng & Units 07/13/2015  Glucose 65 - 99 mg/dL 94  BUN 6 - 24 mg/dL 17  Creatinine 6.04 - 5.40 mg/dL 9.81(X)  Sodium 914 - 782 mmol/L 137  Potassium 3.5 - 5.2 mmol/L 4.0  Chloride 96 - 106 mmol/L 99  CO2 18 - 29 mmol/L 23  Calcium 8.7 - 10.2 mg/dL 9.2  Total Protein 6.0 - 8.5 g/dL 6.6  Total Bilirubin 0.0 - 1.2 mg/dL 0.4  Alkaline Phos 39 - 117 IU/L 73  AST 0 - 40 IU/L 38  ALT 0 - 44 IU/L 70(H)      Assessment & Plan:   Problem List Items Addressed This Visit    Essential hypertension - Primary    Still persistent elevated BP, uncontrolled HTN, non adherent to medications, now taking HCTZ 2x weekly only - Home BP readings none, unable to get checked at work, no cuff at home  - Concern with HTN related HA, without any persistent clinical symptoms today No known complications  Plan:  1. Add new HTN med today, Start Amlodipine 5mg  daily 2. Continue HCTZ 12.5mg  - did not raise dose today due to concerns about hydration status 3. Encourage improved lifestyle - smoking cessation (seems to be improving not quit yet though), low sodium diet, regular exercise 3. Start monitor BP outside office, bring readings to next visit, if persistently >140/90 or new symptoms notify office sooner 4. Follow-up 1 day for nurse BP check, if BP not improved may give advice to take TWO Amlodipine 5mg  pills for dose of 10mg  in about 3 days if BP still remains elevated, but may need chance for new medicine to work. He will return in 6 weeks for  additional blood work and Annual Physical further manage HTN  Strict return criteria given if any acute worsening symptoms recurrence of worse headache neuro symptoms CP/dyspnea, when to go to hospital for ED eval      Relevant Medications   amLODipine (NORVASC) 5 MG tablet   Elevated triglycerides with high cholesterol    Recommend to take cholesterol meds as prescribed, he was non adherent Follow-up labs in 6 weeks      Relevant Medications   amLODipine (NORVASC) 5 MG tablet   Current tobacco use    Reduced to 3 cigs daily Smoking cessation to quit improve BP         Meds ordered  this encounter  Medications  . amLODipine (NORVASC) 5 MG tablet    Sig: Take 1 tablet (5 mg total) by mouth daily.    Dispense:  30 tablet    Refill:  11    Follow up plan: Return in about 6 weeks (around 01/22/2017) for Annual Physical.  Saralyn Pilar, DO [facility] 12/11/2016, 1:15 PM

## 2016-12-11 NOTE — Patient Instructions (Addendum)
Thank you for coming to the clinic today.  1. Start new BP pill Amlodipine 5mg  daily, also continue HCTZ 12.5mg  daily - important to take everyday to control BP - We are trying to reduce risk of heart attack and stroke - Stay well hydrated since on a fluid pill  2. Continue cholesterol medication as prescribed for now, we need to re-check this in future  If you have any significant chest pain that does not go away within 30 minutes, is accompanied by nausea, sweating, shortness of breath, or made worse by activity, this may be evidence of a heart attack, especially if symptoms worsening instead of improving, please call 911 or go directly to the emergency room immediately for evaluation.  If you get numbness or tingling persistent or weakness in arm or leg or face, and difficulty finding words or confusion need to seek immediate medical attention at hospital for potential stroke.  3. DUE for FASTING BLOOD WORK (no food or drink after midnight before the lab appointment, only water or coffee without cream/sugar on the morning of)  SCHEDULE "Lab Only" visit in the morning at the clinic for lab draw in 6 WEEKS  - Make sure Lab Only appointment is at about 1 week before your next appointment, so that results will be available  For Lab Results, once available within 2-3 days of blood draw, you can can log in to MyChart online to view your results and a brief explanation. Also, we can discuss results at next follow-up visit.  Please schedule a Follow-up Appointment to: Return in about 6 weeks (around 01/22/2017) for Annual Physical.  If you have any other questions or concerns, please feel free to call the clinic or send a message through MyChart. You may also schedule an earlier appointment if necessary.  Additionally, you may be receiving a survey about your experience at our clinic within a few days to 1 week by e-mail or mail. We value your feedback.  Saralyn Pilar, DO [facility] [place]

## 2016-12-12 ENCOUNTER — Telehealth: Payer: Self-pay | Admitting: Nurse Practitioner

## 2016-12-12 ENCOUNTER — Encounter: Payer: Self-pay | Admitting: Family Medicine

## 2016-12-12 ENCOUNTER — Ambulatory Visit (INDEPENDENT_AMBULATORY_CARE_PROVIDER_SITE_OTHER): Payer: BLUE CROSS/BLUE SHIELD

## 2016-12-12 ENCOUNTER — Telehealth: Payer: Self-pay

## 2016-12-12 VITALS — BP 184/117 | HR 94 | Ht 68.0 in | Wt 177.0 lb

## 2016-12-12 DIAGNOSIS — I10 Essential (primary) hypertension: Secondary | ICD-10-CM

## 2016-12-12 NOTE — Telephone Encounter (Signed)
Please see the attached work note in pt chart excusing him from work due to his elevated blood pressure until Monday, August 27th. I attempted to contact the pt, no answer. LMOM to return my call.

## 2016-12-12 NOTE — Telephone Encounter (Signed)
Melissa called back from HR and spoke w/ Fleet Contras and informed her that the pt could not return to work with his blood pressure  elevated l. She requested that we send a letter documenting that he's been evaluated and treated. Also excusing him from work until his blood pressure is under control.

## 2016-12-12 NOTE — Telephone Encounter (Signed)
The pt was notified to increase his Amlodipine by one tablet for a total of 2 (tablets) starting in the AM and to continue taking the HCTZ 12.5MG . He verbalize understanding, no questions or concerns.

## 2016-12-12 NOTE — Telephone Encounter (Signed)
Pt was told to take BP at work.  Ryan Bean said she took 3 readings: 173/120 on right arm 170/116 on right arm 176/121 on left arm Her call back number is [phone]

## 2016-12-16 ENCOUNTER — Ambulatory Visit (INDEPENDENT_AMBULATORY_CARE_PROVIDER_SITE_OTHER): Payer: BLUE CROSS/BLUE SHIELD

## 2016-12-16 DIAGNOSIS — Z0000 Encounter for general adult medical examination without abnormal findings: Secondary | ICD-10-CM | POA: Diagnosis not present

## 2016-12-16 DIAGNOSIS — E782 Mixed hyperlipidemia: Secondary | ICD-10-CM

## 2016-12-16 DIAGNOSIS — I10 Essential (primary) hypertension: Secondary | ICD-10-CM

## 2016-12-16 DIAGNOSIS — R7989 Other specified abnormal findings of blood chemistry: Secondary | ICD-10-CM | POA: Diagnosis not present

## 2016-12-16 DIAGNOSIS — Z125 Encounter for screening for malignant neoplasm of prostate: Secondary | ICD-10-CM | POA: Diagnosis not present

## 2016-12-16 DIAGNOSIS — R799 Abnormal finding of blood chemistry, unspecified: Secondary | ICD-10-CM | POA: Diagnosis not present

## 2016-12-16 DIAGNOSIS — R945 Abnormal results of liver function studies: Secondary | ICD-10-CM

## 2016-12-16 NOTE — Progress Notes (Signed)
Pt came in today for a Nurse Visit to f/u on elevated bp. His manual bp today was 138/98. The pt was informed per Dr. Althea Charon if his bp increased to 160/110 then he needs to take (2) two of his Amlodipine 5 MG, but if it remains under he can continue taking the Amlodipine 5MG  and the HCTZ 12.5MG . He verbalize understanding, no questions or concerns.

## 2016-12-17 ENCOUNTER — Telehealth: Payer: Self-pay

## 2016-12-17 ENCOUNTER — Other Ambulatory Visit: Payer: Self-pay | Admitting: Family Medicine

## 2016-12-17 DIAGNOSIS — R7989 Other specified abnormal findings of blood chemistry: Secondary | ICD-10-CM | POA: Insufficient documentation

## 2016-12-17 DIAGNOSIS — E782 Mixed hyperlipidemia: Secondary | ICD-10-CM

## 2016-12-17 DIAGNOSIS — I10 Essential (primary) hypertension: Secondary | ICD-10-CM

## 2016-12-17 DIAGNOSIS — R945 Abnormal results of liver function studies: Secondary | ICD-10-CM

## 2016-12-17 LAB — COMPLETE METABOLIC PANEL WITH GFR
ALBUMIN: 4.3 g/dL (ref 3.6–5.1)
ALK PHOS: 85 U/L (ref 40–115)
ALT: 249 U/L — ABNORMAL HIGH (ref 9–46)
AST: 179 U/L — ABNORMAL HIGH (ref 10–35)
BUN: 12 mg/dL (ref 7–25)
CALCIUM: 9.2 mg/dL (ref 8.6–10.3)
CHLORIDE: 94 mmol/L — AB (ref 98–110)
CO2: 21 mmol/L (ref 20–32)
Creat: 1.15 mg/dL (ref 0.70–1.33)
GFR, EST NON AFRICAN AMERICAN: 73 mL/min (ref >=60)
GFR, Est African American: 84 mL/min (ref >=60)
GLUCOSE: 106 mg/dL — AB (ref 65–99)
POTASSIUM: 3.8 mmol/L (ref 3.5–5.3)
SODIUM: 131 mmol/L — AB (ref 135–146)
Total Bilirubin: 1.2 mg/dL (ref 0.2–1.2)
Total Protein: 6.9 g/dL (ref 6.1–8.1)

## 2016-12-17 LAB — CBC WITH DIFFERENTIAL/PLATELET
BASOS PCT: 0 %
Basophils Absolute: 0 cells/uL (ref 0–200)
EOS PCT: 3 %
Eosinophils Absolute: 138 cells/uL (ref 15–500)
HCT: 49.3 % (ref 38.5–50.0)
Hemoglobin: 17.3 g/dL — ABNORMAL HIGH (ref 13.2–17.1)
LYMPHS PCT: 36 %
Lymphs Abs: 1656 cells/uL (ref 850–3900)
MCH: 32.2 pg (ref 27.0–33.0)
MCHC: 35.1 g/dL (ref 32.0–36.0)
MCV: 91.6 fL (ref 80.0–100.0)
MONOS PCT: 7 %
MPV: 11.5 fL (ref 7.5–12.5)
Monocytes Absolute: 322 cells/uL (ref 200–950)
NEUTROS ABS: 2484 {cells}/uL (ref 1500–7800)
Neutrophils Relative %: 54 %
PLATELETS: 212 10*3/uL (ref 140–400)
RBC: 5.38 MIL/uL (ref 4.20–5.80)
RDW: 15 % (ref 11.0–15.0)
WBC: 4.6 10*3/uL (ref 3.8–10.8)

## 2016-12-17 LAB — LIPID PANEL
CHOLESTEROL: 735 mg/dL — AB (ref <200)
HDL: 20 mg/dL — ABNORMAL LOW (ref >40)
TRIGLYCERIDES: 3869 mg/dL — AB (ref <150)
Total CHOL/HDL Ratio: 36.8 Ratio — ABNORMAL HIGH (ref <5.0)

## 2016-12-17 LAB — PSA, TOTAL WITH REFLEX TO PSA, FREE: PSA, Total: 0.5 ng/mL (ref <=4.0)

## 2016-12-17 LAB — HEMOGLOBIN A1C
HEMOGLOBIN A1C: 5.1 % (ref <5.7)
Mean Plasma Glucose: 100 mg/dL

## 2016-12-17 LAB — TSH: TSH: 1.63 m[IU]/L (ref 0.40–4.50)

## 2016-12-17 MED ORDER — ASPIRIN EC 81 MG PO TBEC: 81.0000 mg | DELAYED_RELEASE_TABLET | Freq: Every day | ORAL | Status: DC

## 2016-12-17 MED ORDER — ICOSAPENT ETHYL 1 G PO CAPS: 2.0000 g | ORAL_CAPSULE | Freq: Two times a day (BID) | ORAL | 5 refills | Status: DC

## 2016-12-17 NOTE — Telephone Encounter (Signed)
The pt called to report that his blood pressure today was 150/101. I reassured the pt that his blood pressure is coming down and that it is not safe to drop a blood pressure to quick . I also informed the pt that it takes time to get his blood pressure regulated. I went back over with him Dr. Kirtland Bouchard recommendation to take the medications as directed and if his blood pressure is greater than 160/110 to increase the Amlodipine by adding (1)one 5MG  tablet. I also informed him that if he develop any symptoms of SOB, chest pain, numbness or tingling that he need to go the ER. He verbalize understanding.

## 2016-12-17 NOTE — Telephone Encounter (Signed)
Discussed with Laurel Dimmer CMA. Agree with her recommendations to patient. BP stabilization will take some time, ultimately by 12/20/16 when he returns for BP re-check we may need to double amlodipine to 10mg  total (2 of the 5mg  tabs) or new rx 10mg  tabs IF BP still >140/90 consistently. He may return sooner for concerns of symptoms or if work restrictions need to be placed due to other work requirements, but as discussed before this is a chronic problem, not a recent new HTN.  Follow-up as planned 8/31 Nurse Visit BP CHECK, if BP >140/90, then increase Amlodipine from 5mg  to 10mg  daily.  If he does not tolerate this change due to prior history of dizziness on this dose. We may have to reduce back to 5mg , and ADD A THIRD AGENT. Likely Lisinopril to help control BP.  Next visit with me 01/2017.  Saralyn Pilar, DO [facility] 12/17/2016, 10:49 AM

## 2016-12-19 ENCOUNTER — Ambulatory Visit (INDEPENDENT_AMBULATORY_CARE_PROVIDER_SITE_OTHER): Payer: BLUE CROSS/BLUE SHIELD

## 2016-12-19 ENCOUNTER — Other Ambulatory Visit: Payer: Self-pay | Admitting: Family Medicine

## 2016-12-19 VITALS — BP 130/90 | HR 88 | Wt 172.0 lb

## 2016-12-19 DIAGNOSIS — R945 Abnormal results of liver function studies: Secondary | ICD-10-CM

## 2016-12-19 DIAGNOSIS — I10 Essential (primary) hypertension: Secondary | ICD-10-CM

## 2016-12-19 DIAGNOSIS — R7989 Other specified abnormal findings of blood chemistry: Secondary | ICD-10-CM

## 2016-12-20 ENCOUNTER — Other Ambulatory Visit: Payer: Self-pay | Admitting: Family Medicine

## 2016-12-20 DIAGNOSIS — I10 Essential (primary) hypertension: Secondary | ICD-10-CM

## 2016-12-20 LAB — COMPLETE METABOLIC PANEL WITH GFR
ALBUMIN: 4.4 g/dL (ref 3.6–5.1)
ALK PHOS: 72 U/L (ref 40–115)
ALT: 105 U/L — ABNORMAL HIGH (ref 9–46)
AST: 48 U/L — ABNORMAL HIGH (ref 10–35)
BILIRUBIN TOTAL: 0.6 mg/dL (ref 0.2–1.2)
BUN: 21 mg/dL (ref 7–25)
CO2: 22 mmol/L (ref 20–32)
Calcium: 9.4 mg/dL (ref 8.6–10.3)
Chloride: 97 mmol/L — ABNORMAL LOW (ref 98–110)
Creat: 1.25 mg/dL (ref 0.70–1.33)
GFR, EST AFRICAN AMERICAN: 76 mL/min (ref >=60)
GFR, EST NON AFRICAN AMERICAN: 66 mL/min (ref >=60)
Glucose, Bld: 84 mg/dL (ref 65–99)
Potassium: 3.5 mmol/L (ref 3.5–5.3)
Sodium: 136 mmol/L (ref 135–146)
TOTAL PROTEIN: 6.7 g/dL (ref 6.1–8.1)

## 2016-12-20 LAB — LACTATE DEHYDROGENASE: LDH: 149 U/L (ref 120–250)

## 2016-12-20 MED ORDER — AMLODIPINE BESYLATE 10 MG PO TABS: 10.0000 mg | ORAL_TABLET | Freq: Every day | ORAL | 11 refills | Status: DC

## 2016-12-26 ENCOUNTER — Ambulatory Visit: Payer: BLUE CROSS/BLUE SHIELD | Admitting: Cardiovascular Disease

## 2017-01-22 ENCOUNTER — Other Ambulatory Visit: Payer: BLUE CROSS/BLUE SHIELD

## 2017-01-29 ENCOUNTER — Encounter: Payer: BLUE CROSS/BLUE SHIELD | Admitting: Family Medicine

## 2017-02-06 ENCOUNTER — Other Ambulatory Visit: Payer: Self-pay

## 2017-02-06 ENCOUNTER — Telehealth: Payer: Self-pay

## 2017-02-06 NOTE — Telephone Encounter (Signed)
I left a detail message on the pt answer machine that he needs to contact his pharmacy for refills.

## 2017-02-06 NOTE — Telephone Encounter (Signed)
Patient called stating he needs a refill on all medications.  Patient could not tell me any names of mediations.  [facility]

## 2017-02-13 ENCOUNTER — Ambulatory Visit: Payer: BLUE CROSS/BLUE SHIELD | Admitting: Cardiovascular Disease

## 2017-02-13 ENCOUNTER — Telehealth: Payer: Self-pay | Admitting: Family Medicine

## 2017-02-13 NOTE — Telephone Encounter (Signed)
Pt. Called requesting a refill on [facility]

## 2017-02-14 ENCOUNTER — Other Ambulatory Visit: Payer: Self-pay

## 2017-02-14 DIAGNOSIS — E782 Mixed hyperlipidemia: Secondary | ICD-10-CM

## 2017-02-14 MED ORDER — GEMFIBROZIL 600 MG PO TABS: 600.0000 mg | ORAL_TABLET | Freq: Two times a day (BID) | ORAL | 5 refills | Status: DC

## 2017-02-14 NOTE — Telephone Encounter (Signed)
Patient notified and med refilled

## 2017-02-18 ENCOUNTER — Encounter: Payer: BLUE CROSS/BLUE SHIELD | Admitting: Family Medicine

## 2017-12-29 ENCOUNTER — Other Ambulatory Visit: Payer: Self-pay | Admitting: Family Medicine

## 2017-12-29 DIAGNOSIS — I10 Essential (primary) hypertension: Secondary | ICD-10-CM

## 2018-01-30 ENCOUNTER — Other Ambulatory Visit: Payer: Self-pay | Admitting: Family Medicine

## 2018-01-30 DIAGNOSIS — I10 Essential (primary) hypertension: Secondary | ICD-10-CM

## 2018-07-06 ENCOUNTER — Other Ambulatory Visit: Payer: Self-pay

## 2018-07-06 ENCOUNTER — Encounter: Payer: Self-pay | Admitting: Family Medicine

## 2018-07-06 ENCOUNTER — Ambulatory Visit: Payer: BLUE CROSS/BLUE SHIELD | Admitting: Family Medicine

## 2018-07-06 ENCOUNTER — Other Ambulatory Visit: Payer: Self-pay | Admitting: Family Medicine

## 2018-07-06 VITALS — BP 164/100 | HR 86 | Temp 98.4°F | Resp 16 | Ht 69.0 in | Wt 176.6 lb

## 2018-07-06 DIAGNOSIS — R945 Abnormal results of liver function studies: Secondary | ICD-10-CM

## 2018-07-06 DIAGNOSIS — E782 Mixed hyperlipidemia: Secondary | ICD-10-CM

## 2018-07-06 DIAGNOSIS — Z720 Tobacco use: Secondary | ICD-10-CM | POA: Diagnosis not present

## 2018-07-06 DIAGNOSIS — Z125 Encounter for screening for malignant neoplasm of prostate: Secondary | ICD-10-CM

## 2018-07-06 DIAGNOSIS — I10 Essential (primary) hypertension: Secondary | ICD-10-CM

## 2018-07-06 DIAGNOSIS — Z0000 Encounter for general adult medical examination without abnormal findings: Secondary | ICD-10-CM

## 2018-07-06 DIAGNOSIS — R7989 Other specified abnormal findings of blood chemistry: Secondary | ICD-10-CM

## 2018-07-06 MED ORDER — AMLODIPINE BESYLATE 10 MG PO TABS: 10.0000 mg | ORAL_TABLET | Freq: Every day | ORAL | 5 refills | Status: DC

## 2018-07-06 NOTE — Progress Notes (Signed)
Subjective:    Patient ID: Ryan Bean, male    DOB: 1965/12/18, 53 y.o.   MRN: [id number]  Ryan Bean is a 53 y.o. male presenting on 07/06/2018 for Hypertension  Last visit with me in 2018, he was lost to follow-up due to change of job and loss insurance, now back on [employer], he is returning to care.  HPI   CHRONIC HTN: - Last visit with me 11/2016, for chronic HTN, treated with adding Amlodipine to existing HCTZ, had problems with med adherence and lost to follow-up, see prior notes for background information. - Interval update with again lost to follow-up for nearly 1.5 years, he was off Amlodipine for months after ran out, now returning to care with insurance. - Today patient reports new concern with admits one episode of dizziness without any vision changes or headache. - His BP at dentist was >170/120 Current Meds - HCTZ 12.5mg  daily, Amlodipine 10mg  daily (OUT currently due refill) Previously. Tolerating well, w/o complaints. Lifestyle - Exercise at home, tries to limit salt in diet. No caffeine intake. Denies CP, dyspnea, edema, dizziness / lightheadedness, headache, blurred vision  Tobacco Abuse Still active smoker, about 4 cigs daily, has tried to wean down.  Health Maintenance: Due for Flu Shot, declines today despite counseling on benefits   Depression screen [facility] 2/9 07/06/2018 06/06/2016 07/12/2015  Decreased Interest 0 0 0  Down, Depressed, Hopeless 0 0 0  PHQ - 2 Score 0 0 0    Social History   Tobacco Use  . Smoking status: Current Every Day Smoker    Packs/day: 0.25    Years: 30.00    Pack years: 7.50  . Smokeless tobacco: Current User  Substance Use Topics  . Alcohol use: Not Currently    Alcohol/week: 2.0 standard drinks    Types: 2 Cans of beer per week    Comment: in past  . Drug use: No    Review of Systems Per HPI unless specifically indicated above     Objective:    BP (!) 164/100 (BP Location: Left Arm, Cuff  Size: Normal)   Pulse 86   Temp 98.4 F (36.9 C) (Oral)   Resp 16   Ht 5\' 9"  (1.753 m)   Wt 176 lb 9.6 oz (80.1 kg)   BMI 26.08 kg/m   Wt Readings from Last 3 Encounters:  07/06/18 176 lb 9.6 oz (80.1 kg)  12/19/16 172 lb (78 kg)  12/16/16 177 lb (80.3 kg)    Physical Exam Vitals signs and nursing note reviewed.  Constitutional:      General: He is not in acute distress.    Appearance: He is well-developed. He is not diaphoretic.     Comments: Well-appearing, comfortable, cooperative  HENT:     Head: Normocephalic and atraumatic.  Eyes:     General:        Right eye: No discharge.        Left eye: No discharge.     Conjunctiva/sclera: Conjunctivae normal.  Neck:     Musculoskeletal: Normal range of motion and neck supple.     Thyroid: No thyromegaly.  Cardiovascular:     Rate and Rhythm: Normal rate and regular rhythm.     Heart sounds: Normal heart sounds. No murmur.  Pulmonary:     Effort: Pulmonary effort is normal. No respiratory distress.     Breath sounds: Normal breath sounds. No wheezing or rales.  Musculoskeletal: Normal range of motion.  Lymphadenopathy:  Cervical: No cervical adenopathy.  Skin:    General: Skin is warm and dry.     Findings: No erythema or rash.  Neurological:     Mental Status: He is alert and oriented to person, place, and time.  Psychiatric:        Behavior: Behavior normal.     Comments: Well groomed, good eye contact, normal speech and thoughts       Results for orders placed or performed in visit on 12/19/16  COMPLETE METABOLIC PANEL WITH GFR  Result Value Ref Range   Sodium 136 135 - 146 mmol/L   Potassium 3.5 3.5 - 5.3 mmol/L   Chloride 97 (L) 98 - 110 mmol/L   CO2 22 20 - 32 mmol/L   Glucose, Bld 84 65 - 99 mg/dL   BUN 21 7 - 25 mg/dL   Creat 3.87 5.64 - 3.32 mg/dL   Total Bilirubin 0.6 0.2 - 1.2 mg/dL   Alkaline Phosphatase 72 40 - 115 U/L   AST 48 (H) 10 - 35 U/L   ALT 105 (H) 9 - 46 U/L   Total Protein 6.7 6.1  - 8.1 g/dL   Albumin 4.4 3.6 - 5.1 g/dL   Calcium 9.4 8.6 - 95.1 mg/dL   GFR, Est African American 76 >=60 mL/min   GFR, Est Non African American 66 >=60 mL/min  Lactate Dehydrogenase (LDH)  Result Value Ref Range   LDH 149 120 - 250 U/L      Assessment & Plan:   Problem List Items Addressed This Visit    Current tobacco use    Active smoker 4 cig daily, unable to quit Smoking cessation offered, not ready      Essential hypertension - Primary    Unconitrolled HTN, off medication due to loss insurance lost to follow-up Home BP readings elevated >150-170 / 110-120 No known complications  Plan:  1. RESTART Amlodipine 10mg  daily today - new order, had been out - CONTINUE HCTZ 12.5mg  daily (not need new rx, question if he was not taking for a while and now restarted) 2. Encourage improved lifestyle - smoking cessation (seems to be improving not quit yet though), low sodium diet, regular exercise 3. REstart monitor BP outside office, bring readings to next visit, if persistently >140/90 or new symptoms notify office sooner 4. Follow-up 3 months Annual Physical with labs      Relevant Medications   amLODipine (NORVASC) 10 MG tablet      Meds ordered this encounter  Medications  . amLODipine (NORVASC) 10 MG tablet    Sig: Take 1 tablet (10 mg total) by mouth daily.    Dispense:  30 tablet    Refill:  5    Follow up plan: Return in about 3 months (around 10/06/2018) for Annual Physical.  Future labs ordered for 09/28/18  Saralyn Pilar, DO [facility] 07/06/2018, 8:13 AM

## 2018-07-06 NOTE — Patient Instructions (Addendum)
Thank you for coming to the office today.  Restart Amlodipine 10mg  daily for blood pressure, rx 30 day sent to [facility], if need 90 day let pharmacy know  Continue other medication  Check BP at home, goal is < 140/90   DUE for FASTING BLOOD WORK (no food or drink after midnight before the lab appointment, only water or coffee without cream/sugar on the morning of)  SCHEDULE "Lab Only" visit in the morning at the clinic for lab draw in 3 MONTHS   - Make sure Lab Only appointment is at about 1 week before your next appointment, so that results will be available  For Lab Results, once available within 2-3 days of blood draw, you can can log in to MyChart online to view your results and a brief explanation. Also, we can discuss results at next follow-up visit.   Please schedule a Follow-up Appointment to: Return in about 3 months (around 10/06/2018) for Annual Physical.  If you have any other questions or concerns, please feel free to call the office or send a message through MyChart. You may also schedule an earlier appointment if necessary.  Additionally, you may be receiving a survey about your experience at our office within a few days to 1 week by e-mail or mail. We value your feedback.  Saralyn Pilar, DO [facility], [place]

## 2018-07-06 NOTE — Assessment & Plan Note (Signed)
Unconitrolled HTN, off medication due to loss insurance lost to follow-up Home BP readings elevated >150-170 / 110-120 No known complications  Plan:  1. RESTART Amlodipine 10mg  daily today - new order, had been out - CONTINUE HCTZ 12.5mg  daily (not need new rx, question if he was not taking for a while and now restarted) 2. Encourage improved lifestyle - smoking cessation (seems to be improving not quit yet though), low sodium diet, regular exercise 3. REstart monitor BP outside office, bring readings to next visit, if persistently >140/90 or new symptoms notify office sooner 4. Follow-up 3 months Annual Physical with labs

## 2018-07-06 NOTE — Assessment & Plan Note (Signed)
Active smoker 4 cig daily, unable to quit Smoking cessation offered, not ready

## 2018-09-28 ENCOUNTER — Other Ambulatory Visit: Payer: 59

## 2018-10-05 ENCOUNTER — Encounter: Payer: 59 | Admitting: Family Medicine

## 2018-11-16 ENCOUNTER — Other Ambulatory Visit: Payer: 59

## 2018-11-23 ENCOUNTER — Encounter: Payer: 59 | Admitting: Family Medicine

## 2018-12-17 ENCOUNTER — Encounter: Payer: 59 | Admitting: Family Medicine

## 2019-02-21 ENCOUNTER — Other Ambulatory Visit: Payer: Self-pay | Admitting: Family Medicine

## 2019-02-21 DIAGNOSIS — I10 Essential (primary) hypertension: Secondary | ICD-10-CM

## 2019-10-01 ENCOUNTER — Telehealth: Payer: Self-pay | Admitting: *Deleted

## 2019-10-01 ENCOUNTER — Other Ambulatory Visit: Payer: Self-pay | Admitting: *Deleted

## 2019-10-01 ENCOUNTER — Other Ambulatory Visit: Payer: Self-pay | Admitting: Family Medicine

## 2019-10-01 DIAGNOSIS — I10 Essential (primary) hypertension: Secondary | ICD-10-CM

## 2019-10-01 MED ORDER — HYDROCHLOROTHIAZIDE 12.5 MG PO TABS: 12.5000 mg | ORAL_TABLET | Freq: Every day | ORAL | 0 refills | Status: DC

## 2019-10-01 MED ORDER — AMLODIPINE BESYLATE 10 MG PO TABS: 10.0000 mg | ORAL_TABLET | Freq: Every day | ORAL | 0 refills | Status: DC

## 2019-10-01 NOTE — Telephone Encounter (Signed)
Attempted to call patient to schedule appointment for 6 month follow up- no voice mail set up- unable to leave message. Attempted to send courtesy RF to pharmacy Rx set as print- attempted to change it twice and unable to change to normal. Office notified by phone call- message sent for PCP - unable to change to normal for pharmacy RF

## 2019-10-01 NOTE — Addendum Note (Signed)
Addended by: Smitty Cords on: 10/01/2019 03:56 PM   Modules accepted: Orders

## 2019-10-01 NOTE — Telephone Encounter (Signed)
Attempted to call patient to schedule appointment- no voice mail set up- unable to leave message. Courtesy RF given#[phone] with appointment note to pharmacy

## 2020-01-03 ENCOUNTER — Other Ambulatory Visit: Payer: Self-pay | Admitting: Family Medicine

## 2020-01-03 DIAGNOSIS — I10 Essential (primary) hypertension: Secondary | ICD-10-CM

## 2020-01-04 ENCOUNTER — Other Ambulatory Visit: Payer: Self-pay | Admitting: Family Medicine

## 2020-01-04 DIAGNOSIS — I10 Essential (primary) hypertension: Secondary | ICD-10-CM

## 2020-01-04 MED ORDER — AMLODIPINE BESYLATE 10 MG PO TABS: 10.0000 mg | ORAL_TABLET | Freq: Every day | ORAL | 0 refills | Status: DC

## 2020-01-04 NOTE — Telephone Encounter (Signed)
Medication Refill - Medication: amLODipine (NORVASC) 10 MG tablet [[id number]]     Preferred Pharmacy (with phone number or street name):  [facility] - [place], [place] - [address]  [address] [place] [address]  Phone: [phone] Fax: [phone]  Hours: Not open 24 hours   Note: Please refill for pt he has set up an appt for Sept 27th at 10:40am with Dr Kirtland Bouchard . He is currently all out of meds for blood pressure   Agent: Please be advised that RX refills may take up to 3 business days. We ask that you follow-up with your pharmacy.

## 2020-01-04 NOTE — Telephone Encounter (Signed)
Pt. Has an appointment 01/17/20. Courtesy refill.

## 2020-01-17 ENCOUNTER — Ambulatory Visit: Payer: Self-pay | Admitting: Family Medicine

## 2020-02-11 ENCOUNTER — Encounter: Payer: Self-pay | Admitting: Family Medicine

## 2020-02-11 ENCOUNTER — Ambulatory Visit (INDEPENDENT_AMBULATORY_CARE_PROVIDER_SITE_OTHER): Payer: Self-pay | Admitting: Family Medicine

## 2020-02-11 ENCOUNTER — Other Ambulatory Visit: Payer: Self-pay

## 2020-02-11 VITALS — BP 147/99 | HR 88 | Temp 97.7°F | Resp 16 | Ht 69.0 in | Wt 181.0 lb

## 2020-02-11 DIAGNOSIS — Z1211 Encounter for screening for malignant neoplasm of colon: Secondary | ICD-10-CM

## 2020-02-11 DIAGNOSIS — I10 Essential (primary) hypertension: Secondary | ICD-10-CM

## 2020-02-11 MED ORDER — AMLODIPINE BESYLATE 10 MG PO TABS: 10.0000 mg | ORAL_TABLET | Freq: Every day | ORAL | 1 refills | Status: DC

## 2020-02-11 MED ORDER — HYDROCHLOROTHIAZIDE 12.5 MG PO TABS: 12.5000 mg | ORAL_TABLET | Freq: Every day | ORAL | 1 refills | Status: DC

## 2020-02-11 NOTE — Patient Instructions (Addendum)
Thank you for coming to the office today.  Refilled meds.  DUE for FASTING BLOOD WORK (no food or drink after midnight before the lab appointment, only water or coffee without cream/sugar on the morning of)  SCHEDULE "Lab Only" visit in the morning at the clinic for lab draw in 3 MONTHS   - Make sure Lab Only appointment is at about 1 week before your next appointment, so that results will be available  For Lab Results, once available within 2-3 days of blood draw, you can can log in to MyChart online to view your results and a brief explanation. Also, we can discuss results at next follow-up visit.  Please schedule a Follow-up Appointment to: Return in about 3 months (around 05/13/2020) for 3 month Annual Physical (AM apt, fasting lab AFTER).  If you have any other questions or concerns, please feel free to call the office or send a message through MyChart. You may also schedule an earlier appointment if necessary.  Additionally, you may be receiving a survey about your experience at our office within a few days to 1 week by e-mail or mail. We value your feedback.  Saralyn Pilar, DO [facility], [place]

## 2020-02-11 NOTE — Progress Notes (Signed)
Subjective:    Patient ID: Ryan Bean, male    DOB: [birth year], 54 y.o.   MRN: [id number]  Ryan Bean is a 54 y.o. male presenting on 02/11/2020 for Hypertension   HPI   CHRONIC HTN: - Last visit with me 06/2018, for chronic HTN improved on med regimen He has been out of med, given refills but now >1 since last visit Asymptomatic No BP readings Current Meds - HCTZ 12.5mg  daily, Amlodipine 10mg  daily (OUT currently due refill) Previously. Tolerating well, w/o complaints. Lifestyle - Exercise at home, tries to limit salt in diet. No caffeine intake. Denies CP, dyspnea, edema, dizziness / lightheadedness, headache, blurred vision  HYPERLIPIDEMIA: - Reports no concerns. Last lipid panel in 2020, due for repeat, he is off cholesterol meds now gemfibrozil fish oil   Health Maintenance:  Colon CA Screening: Never had colonoscopy or screening.  Currently asymptomatic. No known family history of colon CA. Due for screening test considering Cologuard, counseling given - interested in order.   Depression screen [facility] 2/9 02/11/2020 07/06/2018 06/06/2016  Decreased Interest 0 0 0  Down, Depressed, Hopeless 0 0 0  PHQ - 2 Score 0 0 0    Social History   Tobacco Use  . Smoking status: Current Every Day Smoker    Packs/day: 0.25    Years: 30.00    Pack years: 7.50  . Smokeless tobacco: Current User  Substance Use Topics  . Alcohol use: Not Currently    Alcohol/week: 2.0 standard drinks    Types: 2 Cans of beer per week    Comment: in past  . Drug use: No    Review of Systems Per HPI unless specifically indicated above     Objective:    BP (!) 147/99   Pulse 88   Temp 97.7 F (36.5 C) (Temporal)   Resp 16   Ht 5\' 9"  (1.753 m)   Wt 181 lb (82.1 kg)   SpO2 100%   BMI 26.73 kg/m   Wt Readings from Last 3 Encounters:  02/11/20 181 lb (82.1 kg)  07/06/18 176 lb 9.6 oz (80.1 kg)  12/19/16 172 lb (78 kg)    Physical Exam Vitals and nursing note  reviewed.  Constitutional:      General: He is not in acute distress.    Appearance: He is well-developed. He is not diaphoretic.     Comments: Well-appearing, comfortable, cooperative  HENT:     Head: Normocephalic and atraumatic.  Eyes:     General:        Right eye: No discharge.        Left eye: No discharge.     Conjunctiva/sclera: Conjunctivae normal.  Cardiovascular:     Rate and Rhythm: Normal rate.  Pulmonary:     Effort: Pulmonary effort is normal.  Skin:    General: Skin is warm and dry.     Findings: No erythema or rash.  Neurological:     Mental Status: He is alert and oriented to person, place, and time.  Psychiatric:        Behavior: Behavior normal.     Comments: Well groomed, good eye contact, normal speech and thoughts    Results for orders placed or performed in visit on 12/19/16  COMPLETE METABOLIC PANEL WITH GFR  Result Value Ref Range   Sodium 136 135 - 146 mmol/L   Potassium 3.5 3.5 - 5.3 mmol/L   Chloride 97 (L) 98 - 110 mmol/L   CO2 22  20 - 32 mmol/L   Glucose, Bld 84 65 - 99 mg/dL   BUN 21 7 - 25 mg/dL   Creat 6.76 7.20 - 9.47 mg/dL   Total Bilirubin 0.6 0.2 - 1.2 mg/dL   Alkaline Phosphatase 72 40 - 115 U/L   AST 48 (H) 10 - 35 U/L   ALT 105 (H) 9 - 46 U/L   Total Protein 6.7 6.1 - 8.1 g/dL   Albumin 4.4 3.6 - 5.1 g/dL   Calcium 9.4 8.6 - 09.6 mg/dL   GFR, Est African American 76 >=60 mL/min   GFR, Est Non African American 66 >=60 mL/min  Lactate Dehydrogenase (LDH)  Result Value Ref Range   LDH 149 120 - 250 U/L      Assessment & Plan:   Problem List Items Addressed This Visit    Essential hypertension - Primary    Unconitrolled HTN, off medication due to loss insurance lost to follow-up Home BP readings elevated No known complications  Plan:  1. RESTART Amlodipine 10mg  daily and HCTZ 12.5mg  daily 2. Encourage improved lifestyle - smoking cessation (seems to be improving not quit yet though), low sodium diet, regular exercise 3.  R\estart monitor BP outside office, bring readings to next visit, if persistently >140/90 or new symptoms notify office sooner 4. Follow-up 3 months Annual Physical with labs      Relevant Medications   amLODipine (NORVASC) 10 MG tablet   hydrochlorothiazide (HYDRODIURIL) 12.5 MG tablet    Other Visit Diagnoses    Screening for colon cancer       Relevant Orders   Cologuard       Due for routine colon cancer screening. Never had colonoscopy (not interested), no family history colon cancer. - Discussion today about recommendations for either Colonoscopy or Cologuard screening, benefits and risks of screening, interested in Cologuard, understands that if positive then recommendation is for diagnostic colonoscopy to follow-up. - Ordered Cologuard today    Orders Placed This Encounter  Procedures  . Cologuard    Meds ordered this encounter  Medications  . amLODipine (NORVASC) 10 MG tablet    Sig: Take 1 tablet (10 mg total) by mouth daily.    Dispense:  90 tablet    Refill:  1  . hydrochlorothiazide (HYDRODIURIL) 12.5 MG tablet    Sig: Take 1 tablet (12.5 mg total) by mouth daily.    Dispense:  90 tablet    Refill:  1     Follow up plan: Return in about 3 months (around 05/13/2020) for 3 month Annual Physical (AM apt, fasting lab AFTER).   05/15/2020, DO [facility] 02/11/2020, 11:49 AM

## 2020-02-11 NOTE — Assessment & Plan Note (Signed)
Unconitrolled HTN, off medication due to loss insurance lost to follow-up Home BP readings elevated No known complications  Plan:  1. RESTART Amlodipine 10mg  daily and HCTZ 12.5mg  daily 2. Encourage improved lifestyle - smoking cessation (seems to be improving not quit yet though), low sodium diet, regular exercise 3. R\estart monitor BP outside office, bring readings to next visit, if persistently >140/90 or new symptoms notify office sooner 4. Follow-up 3 months Annual Physical with labs

## 2020-03-27 ENCOUNTER — Other Ambulatory Visit: Payer: Self-pay | Admitting: Family Medicine

## 2020-03-27 DIAGNOSIS — I10 Essential (primary) hypertension: Secondary | ICD-10-CM

## 2020-03-27 MED ORDER — AMLODIPINE BESYLATE 10 MG PO TABS: 10.0000 mg | ORAL_TABLET | Freq: Every day | ORAL | 1 refills | Status: DC

## 2020-03-27 MED ORDER — HYDROCHLOROTHIAZIDE 12.5 MG PO TABS: 12.5000 mg | ORAL_TABLET | Freq: Every day | ORAL | 1 refills | Status: DC

## 2020-03-27 NOTE — Telephone Encounter (Signed)
Pt has moved to [place] and never picked up his  amLODipine (NORVASC) 10 MG tablet  hydrochlorothiazide (HYDRODIURIL) 12.5 MG tablet  Pt states he called the pharmacy and they told him to call his dr. Rock Nephew would like you to resend these 2 meds to [id number] [place], [place] - [address] Phone:  [phone]  Fax:  [phone]

## 2020-05-11 ENCOUNTER — Encounter: Payer: Self-pay | Admitting: Family Medicine

## 2020-05-12 ENCOUNTER — Encounter: Payer: Self-pay | Admitting: Family Medicine

## 2020-06-08 ENCOUNTER — Ambulatory Visit (INDEPENDENT_AMBULATORY_CARE_PROVIDER_SITE_OTHER): Payer: BC Managed Care – PPO | Admitting: Family Medicine

## 2020-06-08 ENCOUNTER — Encounter: Payer: Self-pay | Admitting: Family Medicine

## 2020-06-08 ENCOUNTER — Other Ambulatory Visit: Payer: Self-pay

## 2020-06-08 VITALS — BP 140/84 | HR 81 | Temp 97.8°F | Ht 69.0 in | Wt 188.0 lb

## 2020-06-08 DIAGNOSIS — Z125 Encounter for screening for malignant neoplasm of prostate: Secondary | ICD-10-CM | POA: Diagnosis not present

## 2020-06-08 DIAGNOSIS — Z1211 Encounter for screening for malignant neoplasm of colon: Secondary | ICD-10-CM

## 2020-06-08 DIAGNOSIS — E782 Mixed hyperlipidemia: Secondary | ICD-10-CM

## 2020-06-08 DIAGNOSIS — I10 Essential (primary) hypertension: Secondary | ICD-10-CM | POA: Diagnosis not present

## 2020-06-08 DIAGNOSIS — Z1159 Encounter for screening for other viral diseases: Secondary | ICD-10-CM | POA: Diagnosis not present

## 2020-06-08 DIAGNOSIS — R7309 Other abnormal glucose: Secondary | ICD-10-CM | POA: Diagnosis not present

## 2020-06-08 DIAGNOSIS — Z0000 Encounter for general adult medical examination without abnormal findings: Secondary | ICD-10-CM

## 2020-06-08 MED ORDER — AMLODIPINE BESYLATE 10 MG PO TABS
10.0000 mg | ORAL_TABLET | Freq: Every day | ORAL | 3 refills | Status: DC
Start: 2020-06-08 — End: 2021-10-22

## 2020-06-08 MED ORDER — HYDROCHLOROTHIAZIDE 25 MG PO TABS: 25.0000 mg | ORAL_TABLET | Freq: Every day | ORAL | 3 refills | Status: DC

## 2020-06-08 NOTE — Progress Notes (Signed)
Subjective:    Patient ID: Ryan Bean, male    DOB: 1965/09/26, 55 y.o.   MRN: [id number]  Ryan Bean is a 55 y.o. male presenting on 06/08/2020 for Annual Exam and Hypertension   HPI   Here for Annual Physical and Due for fasting labs.  CHRONIC HTN: Prior history elevated BP Home readings reported up to DBP 90-100 and SBP 140s Current Meds - HCTZ 12.5mg daily, Amlodipine 10mg  daily Previously. Tolerating well, w/o complaints. Lifestyle - Exercise at home, tries to limit salt in diet. No caffeine intake. Denies CP, dyspnea, edema, dizziness / lightheadedness, headache, blurred vision  Hyperlipidemia Due for labs today fasting. Prior on fibrate for cholesterol On ASA 81  Health Maintenance:  Colon CA Screening: Never had colonoscopy or screening.  Currently asymptomatic. No known family history of colon CA. Due for screening test considering Cologuard, counseling given - interested in order.  Due PSA screening for prostate cancer.  Did not receive cologuard when ordered in past.  TDap UTD  Declines COVID Vaccine  Depression screen [facility] 2/9 02/11/2020 07/06/2018 06/06/2016  Decreased Interest 0 0 0  Down, Depressed, Hopeless 0 0 0  PHQ - 2 Score 0 0 0    Past Medical History:  Diagnosis Date  . Allergy   . Arthritis   . Hypertension    History reviewed. No pertinent surgical history. Social History   Socioeconomic History  . Marital status: Married    Spouse name: Not on file  . Number of children: Not on file  . Years of education: Not on file  . Highest education level: Not on file  Occupational History  . Not on file  Tobacco Use  . Smoking status: Current Every Day Smoker    Packs/day: 0.25    Years: 30.00    Pack years: 7.50  . Smokeless tobacco: Current User  Substance and Sexual Activity  . Alcohol use: Not Currently    Alcohol/week: 2.0 standard drinks    Types: 2 Cans of beer per week    Comment: in past  . Drug use:  No  . Sexual activity: Not on file  Other Topics Concern  . Not on file  Social History Narrative  . Not on file   Social Determinants of Health   Financial Resource Strain: Not on file  Food Insecurity: Not on file  Transportation Needs: Not on file  Physical Activity: Not on file  Stress: Not on file  Social Connections: Not on file  Intimate Partner Violence: Not on file   Family History  Problem Relation Age of Onset  . Hypertension Mother   . Hypertension Father   . Colon cancer Neg Hx    Current Outpatient Medications on File Prior to Visit  Medication Sig  . aspirin EC 81 MG tablet Take 1 tablet (81 mg total) by mouth daily.   No current facility-administered medications on file prior to visit.    Review of Systems Per HPI unless specifically indicated above      Objective:    BP 140/84 (BP Location: Left Arm, Cuff Size: Normal)   Pulse 81   Temp 97.8 F (36.6 C) (Temporal)   Ht 5\' 9"  (1.753 m)   Wt 188 lb (85.3 kg)   SpO2 100%   BMI 27.76 kg/m   Wt Readings from Last 3 Encounters:  06/08/20 188 lb (85.3 kg)  02/11/20 181 lb (82.1 kg)  07/06/18 176 lb 9.6 oz (80.1 kg)    Physical  Exam    Results for orders placed or performed in visit on 12/19/16  COMPLETE METABOLIC PANEL WITH GFR  Result Value Ref Range   Sodium 136 135 - 146 mmol/L   Potassium 3.5 3.5 - 5.3 mmol/L   Chloride 97 (L) 98 - 110 mmol/L   CO2 22 20 - 32 mmol/L   Glucose, Bld 84 65 - 99 mg/dL   BUN 21 7 - 25 mg/dL   Creat 9.56 3.87 - 5.64 mg/dL   Total Bilirubin 0.6 0.2 - 1.2 mg/dL   Alkaline Phosphatase 72 40 - 115 U/L   AST 48 (H) 10 - 35 U/L   ALT 105 (H) 9 - 46 U/L   Total Protein 6.7 6.1 - 8.1 g/dL   Albumin 4.4 3.6 - 5.1 g/dL   Calcium 9.4 8.6 - 33.2 mg/dL   GFR, Est African American 76 >=60 mL/min   GFR, Est Non African American 66 >=60 mL/min  Lactate Dehydrogenase (LDH)  Result Value Ref Range   LDH 149 120 - 250 U/L      Assessment & Plan:   Problem List  Items Addressed This Visit    Essential hypertension    Improved HTN control Home BP readings elevated still No known complications  Plan:  1. Increase HCTZ to 25mg  daily, new rx sent 2. Continue Amlodipine 10mg  daily 3. Encourage improved lifestyle - smoking cessation (seems to be improving not quit yet though), low sodium diet, regular exercise 4. monitor BP outside office, bring readings to next visit, if persistently >140/90 or new symptoms notify office sooner      Relevant Medications   hydrochlorothiazide (HYDRODIURIL) 25 MG tablet   amLODipine (NORVASC) 10 MG tablet   Other Relevant Orders   CBC with Differential/Platelet   COMPLETE METABOLIC PANEL WITH GFR   Elevated triglycerides with high cholesterol   Relevant Medications   hydrochlorothiazide (HYDRODIURIL) 25 MG tablet   amLODipine (NORVASC) 10 MG tablet   Other Relevant Orders   Lipid panel   TSH    Other Visit Diagnoses    Annual physical exam    -  Primary   Relevant Orders   Hemoglobin A1c   CBC with Differential/Platelet   COMPLETE METABOLIC PANEL WITH GFR   Lipid panel   Screening for colon cancer       Relevant Orders   Cologuard   Need for hepatitis C screening test       Relevant Orders   Hepatitis C antibody   Screening for prostate cancer       Relevant Orders   PSA   Abnormal glucose       Relevant Orders   Hemoglobin A1c      Updated Health Maintenance information Fasting labs ordered today, f/u results Encouraged improvement to lifestyle with diet and exercise Goal of weight loss   Meds ordered this encounter  Medications  . hydrochlorothiazide (HYDRODIURIL) 25 MG tablet    Sig: Take 1 tablet (25 mg total) by mouth daily.    Dispense:  90 tablet    Refill:  3  . amLODipine (NORVASC) 10 MG tablet    Sig: Take 1 tablet (10 mg total) by mouth daily.    Dispense:  90 tablet    Refill:  3    Add refills      Follow up plan: Return in about 6 months (around 12/06/2020) for 6  month HTN.  , DO [facility] 06/08/2020, 9:43  AM

## 2020-06-08 NOTE — Patient Instructions (Addendum)
Thank you for coming to the office today.  Keep on Amlodipine 10mg  daily  For Hydrochlorothiazide 12.5mg  - take TWO pills at once daily now to improve blood pressure, total dose is 25mg , ordered new pills for you as well for stronger dose.  Colon Cancer Screening: - For all adults age 56+ routine colon cancer screening is highly recommended.     - Recent guidelines from [employer] recommend starting age of 59 - Early detection of colon cancer is important, because often there are no warning signs or symptoms, also if found early usually it can be cured. Late stage is hard to treat.  - If you are not interested in Colonoscopy screening (if done and normal you could be cleared for 5 to 10 years until next due), then Cologuard is an excellent alternative for screening test for Colon Cancer. It is highly sensitive for detecting DNA of colon cancer from even the earliest stages. Also, there is NO bowel prep required. - If Cologuard is NEGATIVE, then it is good for 3 years before next due - If Cologuard is POSITIVE, then it is strongly advised to get a Colonoscopy, which allows the GI doctor to locate the source of the cancer or polyp (even very early stage) and treat it by removing it. ------------------------- ORDERED TODAY Call 55+ OR them in 2 weeks if not received Follow instructions to collect sample, you may call the company for any help or questions, 24/7 telephone support at [phone].    Please schedule a Follow-up Appointment to: Return in about 6 months (around 12/06/2020) for 6 month HTN.  If you have any other questions or concerns, please feel free to call the office or send a message through MyChart. You may also schedule an earlier appointment if necessary.  Additionally, you may be receiving a survey about your experience at our office within a few days to 1 week by e-mail or mail. We value your feedback.  [phone], DO [facility],  Saralyn Pilar

## 2020-06-08 NOTE — Assessment & Plan Note (Signed)
Improved HTN control Home BP readings elevated still No known complications  Plan:  1. Increase HCTZ to 25mg  daily, new rx sent 2. Continue Amlodipine 10mg  daily 3. Encourage improved lifestyle - smoking cessation (seems to be improving not quit yet though), low sodium diet, regular exercise 4. monitor BP outside office, bring readings to next visit, if persistently >140/90 or new symptoms notify office sooner

## 2020-06-09 LAB — COMPLETE METABOLIC PANEL WITH GFR
AG Ratio: 2 (calc) (ref 1.0–2.5)
ALT: 51 U/L — ABNORMAL HIGH (ref 9–46)
AST: 34 U/L (ref 10–35)
Albumin: 4.9 g/dL (ref 3.6–5.1)
Alkaline phosphatase (APISO): 80 U/L (ref 35–144)
BUN: 13 mg/dL (ref 7–25)
CO2: 26 mmol/L (ref 20–32)
Calcium: 9.6 mg/dL (ref 8.6–10.3)
Chloride: 105 mmol/L (ref 98–110)
Creat: 1.01 mg/dL (ref 0.70–1.33)
GFR, Est African American: 97 mL/min/{1.73_m2} (ref >=60)
GFR, Est Non African American: 83 mL/min/{1.73_m2} (ref >=60)
Globulin: 2.5 g/dL (calc) (ref 1.9–3.7)
Glucose, Bld: 93 mg/dL (ref 65–99)
Potassium: 3.9 mmol/L (ref 3.5–5.3)
Sodium: 141 mmol/L (ref 135–146)
Total Bilirubin: 0.6 mg/dL (ref 0.2–1.2)
Total Protein: 7.4 g/dL (ref 6.1–8.1)

## 2020-06-09 LAB — HEMOGLOBIN A1C
Hgb A1c MFr Bld: 5.1 % of total Hgb (ref <5.7)
Mean Plasma Glucose: 100 mg/dL
eAG (mmol/L): 5.5 mmol/L

## 2020-06-09 LAB — CBC WITH DIFFERENTIAL/PLATELET
Absolute Monocytes: 481 cells/uL (ref 200–950)
Basophils Absolute: 52 cells/uL (ref 0–200)
Basophils Relative: 0.8 %
Eosinophils Absolute: 202 cells/uL (ref 15–500)
Eosinophils Relative: 3.1 %
HCT: 46.6 % (ref 38.5–50.0)
Hemoglobin: 15.8 g/dL (ref 13.2–17.1)
Lymphs Abs: 2457 cells/uL (ref 850–3900)
MCH: 30 pg (ref 27.0–33.0)
MCHC: 33.9 g/dL (ref 32.0–36.0)
MCV: 88.4 fL (ref 80.0–100.0)
MPV: 11.1 fL (ref 7.5–12.5)
Monocytes Relative: 7.4 %
Neutro Abs: 3309 cells/uL (ref 1500–7800)
Neutrophils Relative %: 50.9 %
Platelets: 239 10*3/uL (ref 140–400)
RBC: 5.27 10*6/uL (ref 4.20–5.80)
RDW: 13.8 % (ref 11.0–15.0)
Total Lymphocyte: 37.8 %
WBC: 6.5 10*3/uL (ref 3.8–10.8)

## 2020-06-09 LAB — HEPATITIS C ANTIBODY
Hepatitis C Ab: NONREACTIVE
SIGNAL TO CUT-OFF: 0.01 (ref <1.00)

## 2020-06-09 LAB — LIPID PANEL
Cholesterol: 209 mg/dL — ABNORMAL HIGH (ref <200)
HDL: 48 mg/dL (ref >=40)
Non-HDL Cholesterol (Calc): 161 mg/dL (calc) — ABNORMAL HIGH (ref <130)
Total CHOL/HDL Ratio: 4.4 (calc) (ref <5.0)
Triglycerides: 442 mg/dL — ABNORMAL HIGH (ref <150)

## 2020-06-09 LAB — PSA: PSA: 0.53 ng/mL (ref <=4.0)

## 2020-06-09 LAB — TSH: TSH: 2.63 mIU/L (ref 0.40–4.50)

## 2020-07-17 ENCOUNTER — Ambulatory Visit: Payer: BC Managed Care – PPO | Admitting: Family Medicine

## 2020-07-18 ENCOUNTER — Ambulatory Visit (INDEPENDENT_AMBULATORY_CARE_PROVIDER_SITE_OTHER): Payer: BC Managed Care – PPO | Admitting: Family Medicine

## 2020-07-18 ENCOUNTER — Encounter: Payer: Self-pay | Admitting: Family Medicine

## 2020-07-18 ENCOUNTER — Other Ambulatory Visit: Payer: Self-pay

## 2020-07-18 ENCOUNTER — Ambulatory Visit: Payer: BC Managed Care – PPO | Admitting: Family Medicine

## 2020-07-18 VITALS — BP 158/94 | HR 96 | Temp 97.5°F | Ht 69.0 in | Wt 182.0 lb

## 2020-07-18 DIAGNOSIS — M5136 Other intervertebral disc degeneration, lumbar region: Secondary | ICD-10-CM | POA: Insufficient documentation

## 2020-07-18 DIAGNOSIS — M51369 Other intervertebral disc degeneration, lumbar region without mention of lumbar back pain or lower extremity pain: Secondary | ICD-10-CM | POA: Insufficient documentation

## 2020-07-18 DIAGNOSIS — M5442 Lumbago with sciatica, left side: Secondary | ICD-10-CM

## 2020-07-18 MED ORDER — PREDNISONE 10 MG PO TABS: ORAL_TABLET | ORAL | 0 refills | Status: DC

## 2020-07-18 NOTE — Patient Instructions (Addendum)
Thank you for coming to the office today.  1. For your Back Pain - I think that this is due to Muscle Spasms or strain. Your Sciatic Nerve can be affected causing some of your radiation and numbness down your legs.  - Start Prednisone taper (steroid anti-inflammatory) for nerve irritation with pain in legs. Each pill is 10mg . Take 6 pills (60mg  daily) for 1 day at same time with breakfast, then each day reduce dose by 1 pill, so 5 pills, then 4, then 3, then 2 then 1 (last 6 days). Do not take any Ibuprofen or Aleve while taking the Prednisone.  - Once finished Prednisone, then start with other anti-inflammatory Ibuprofen 600mg  per dose with food every 6 to 8 hours or 3 times a day, take it every day for at least 2 to 4 weeks then only as needed  May use Tylenol Extra Str 500mg  tabs - may take 1-2 tablets every 6 hours as needed  Recommend to start using heating pad on your lower back 1-2x daily for few weeks  Also try a Wedge Seat Cushion to avoid nerve pinching when sitting prolonged period of time.  This pain may take weeks to months to fully resolve, but hopefully it will respond to the medicine initially. All back injuries (small or serious) are slow to heal since we use our back muscles every day. Be careful with turning, twisting, lifting, sitting / standing for prolonged periods, and avoid re-injury.  If your symptoms significantly worsen with more pain, or new symptoms with weakness in one or both legs, new or different shooting leg pains, numbness in legs or groin, loss of control or retention of urine or bowel movements, please call back for advice and you may need to go directly to the Emergency Department.    Please schedule a Follow-up Appointment to: Return in about 4 weeks (around 08/15/2020), or if symptoms worsen or fail to improve, for 4 weeks if back pain unresolved.  If you have any other questions or concerns, please feel free to call the office or send a message through  MyChart. You may also schedule an earlier appointment if necessary.  Additionally, you may be receiving a survey about your experience at our office within a few days to 1 week by e-mail or mail. We value your feedback.  , DO [facility], 
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
## 2020-07-24 ENCOUNTER — Telehealth: Payer: BC Managed Care – PPO | Admitting: Family Medicine

## 2020-07-25 ENCOUNTER — Telehealth (INDEPENDENT_AMBULATORY_CARE_PROVIDER_SITE_OTHER): Payer: BC Managed Care – PPO | Admitting: Family Medicine

## 2020-07-25 ENCOUNTER — Encounter: Payer: Self-pay | Admitting: Family Medicine

## 2020-07-25 ENCOUNTER — Other Ambulatory Visit: Payer: Self-pay

## 2020-07-25 VITALS — BP 169/100 | Ht 69.0 in | Wt 182.0 lb

## 2020-07-25 DIAGNOSIS — M5442 Lumbago with sciatica, left side: Secondary | ICD-10-CM | POA: Diagnosis not present

## 2020-07-25 NOTE — Patient Instructions (Signed)
Thank you for coming to the office today.    Please schedule a Follow-up Appointment to: No follow-ups on file.  If you have any other questions or concerns, please feel free to call the office or send a message through MyChart. You may also schedule an earlier appointment if necessary.  Additionally, you may be receiving a survey about your experience at our office within a few days to 1 week by e-mail or mail. We value your feedback.  Iyari Hagner, DO [facility], [facility] 
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
## 2020-11-16 ENCOUNTER — Other Ambulatory Visit: Payer: Self-pay

## 2020-11-16 ENCOUNTER — Encounter: Payer: Self-pay | Admitting: Family Medicine

## 2020-11-16 ENCOUNTER — Ambulatory Visit (INDEPENDENT_AMBULATORY_CARE_PROVIDER_SITE_OTHER): Payer: BC Managed Care – PPO | Admitting: Family Medicine

## 2020-11-16 VITALS — BP 164/98 | HR 85 | Ht 69.0 in | Wt 186.8 lb

## 2020-11-16 DIAGNOSIS — M62830 Muscle spasm of back: Secondary | ICD-10-CM | POA: Diagnosis not present

## 2020-11-16 DIAGNOSIS — M5442 Lumbago with sciatica, left side: Secondary | ICD-10-CM

## 2020-11-16 DIAGNOSIS — G8929 Other chronic pain: Secondary | ICD-10-CM | POA: Diagnosis not present

## 2020-11-16 MED ORDER — METHOCARBAMOL 500 MG PO TABS: 500.0000 mg | ORAL_TABLET | Freq: Four times a day (QID) | ORAL | 3 refills | Status: DC | PRN

## 2020-11-16 MED ORDER — LIDOCAINE 5 % EX PTCH: 1.0000 | MEDICATED_PATCH | CUTANEOUS | 2 refills | Status: DC
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
## 2020-11-16 NOTE — Patient Instructions (Addendum)
Thank you for coming to the office today.  [facility] [address] [place], [place] [address]  Ph [phone]   [facility] [address], [place], [place] [address] Ph: [phone]   Lidoderm patch and muscle relaxant   Please schedule a Follow-up Appointment to: Return if symptoms worsen or fail to improve.  If you have any other questions or concerns, please feel free to call the office or send a message through MyChart. You may also schedule an earlier appointment if necessary.  Additionally, you may be receiving a survey about your experience at our office within a few days to 1 week by e-mail or mail. We value your feedback.  Saralyn Pilar, DO [facility], [place]

## 2020-12-05 ENCOUNTER — Ambulatory Visit: Payer: BC Managed Care – PPO | Admitting: Family Medicine

## 2021-01-16 ENCOUNTER — Encounter: Payer: Self-pay | Admitting: Family Medicine

## 2021-01-16 ENCOUNTER — Other Ambulatory Visit: Payer: Self-pay | Admitting: Family Medicine

## 2021-01-16 ENCOUNTER — Other Ambulatory Visit: Payer: Self-pay

## 2021-01-16 ENCOUNTER — Ambulatory Visit (INDEPENDENT_AMBULATORY_CARE_PROVIDER_SITE_OTHER): Payer: BC Managed Care – PPO | Admitting: Family Medicine

## 2021-01-16 VITALS — BP 143/89 | HR 82 | Ht 69.0 in | Wt 189.4 lb

## 2021-01-16 DIAGNOSIS — Z125 Encounter for screening for malignant neoplasm of prostate: Secondary | ICD-10-CM

## 2021-01-16 DIAGNOSIS — I10 Essential (primary) hypertension: Secondary | ICD-10-CM | POA: Diagnosis not present

## 2021-01-16 DIAGNOSIS — M62830 Muscle spasm of back: Secondary | ICD-10-CM | POA: Diagnosis not present

## 2021-01-16 DIAGNOSIS — R7309 Other abnormal glucose: Secondary | ICD-10-CM

## 2021-01-16 DIAGNOSIS — Z0000 Encounter for general adult medical examination without abnormal findings: Secondary | ICD-10-CM

## 2021-01-16 DIAGNOSIS — G8929 Other chronic pain: Secondary | ICD-10-CM | POA: Diagnosis not present

## 2021-01-16 DIAGNOSIS — E782 Mixed hyperlipidemia: Secondary | ICD-10-CM

## 2021-01-16 DIAGNOSIS — M5442 Lumbago with sciatica, left side: Secondary | ICD-10-CM

## 2021-01-16 MED ORDER — METHOCARBAMOL 500 MG PO TABS: 500.0000 mg | ORAL_TABLET | Freq: Four times a day (QID) | ORAL | 5 refills | Status: DC | PRN

## 2021-01-16 MED ORDER — LIDOCAINE 5 % EX PTCH: 1.0000 | MEDICATED_PATCH | CUTANEOUS | 5 refills | Status: DC
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
## 2021-01-16 NOTE — Assessment & Plan Note (Signed)
Mild elevated BP today but overall improved Improved Home BP  No known complications  Plan:  1. Continue Amlodipine 10mg  daily, HCTZ 25mg  daily 2. Encourage improved lifestyle - smoking cessation (seems to be improving not quit yet though), low sodium diet, regular exercise 3. monitor BP outside office, bring readings to next visit, if persistently >140/90 or new symptoms notify office sooner

## 2021-01-16 NOTE — Patient Instructions (Addendum)
Thank you for coming to the office today.  BP medications are good to refill at the pharmacy through February 2023 - may run out if we see you in March, so please call or message before running out we can extend the refills.  Refilled Lidocaine Patches and Methocarbamol  [facility] [address] [place], [place] [address]  Ph [phone]   Use compression for finger swelling. - use the Coban stretchy wrap.   DUE for FASTING BLOOD WORK (no food or drink after midnight before the lab appointment, only water or coffee without cream/sugar on the morning of)  SCHEDULE "Lab Only" visit in the morning at the clinic for lab draw in 6 MONTHS   - Make sure Lab Only appointment is at about 1 week before your next appointment, so that results will be available  For Lab Results, once available within 2-3 days of blood draw, you can can log in to MyChart online to view your results and a brief explanation. Also, we can discuss results at next follow-up visit.   Please schedule a Follow-up Appointment to: Return in about 6 months (around 07/16/2021) for 6 month fasting lab only then 1 week later Annual Physical.  If you have any other questions or concerns, please feel free to call the office or send a message through MyChart. You may also schedule an earlier appointment if necessary.  Additionally, you may be receiving a survey about your experience at our office within a few days to 1 week by e-mail or mail. We value your feedback.  Saralyn Pilar, DO [facility], [place]
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
## 2021-04-24 ENCOUNTER — Other Ambulatory Visit: Payer: Self-pay

## 2021-04-24 ENCOUNTER — Ambulatory Visit: Payer: BC Managed Care – PPO | Admitting: Family Medicine

## 2021-04-24 ENCOUNTER — Encounter: Payer: Self-pay | Admitting: Family Medicine

## 2021-04-24 VITALS — BP 155/86 | HR 81 | Ht 69.0 in | Wt 191.6 lb

## 2021-04-24 DIAGNOSIS — L729 Follicular cyst of the skin and subcutaneous tissue, unspecified: Secondary | ICD-10-CM

## 2021-04-24 DIAGNOSIS — Z1211 Encounter for screening for malignant neoplasm of colon: Secondary | ICD-10-CM | POA: Diagnosis not present

## 2021-04-24 NOTE — Patient Instructions (Addendum)
Thank you for coming to the office today.  Left groin fluid filled cyst about 2 cm. Keep track of it, over next 2-3 months if still present or bigger or new symptoms, please let me know or come back we can refer you to have it drained if needed or imaging ultrasound if needed  Please schedule a Follow-up Appointment to: Return if symptoms worsen or fail to improve, for cyst.  If you have any other questions or concerns, please feel free to call the office or send a message through MyChart. You may also schedule an earlier appointment if necessary.  Additionally, you may be receiving a survey about your experience at our office within a few days to 1 week by e-mail or mail. We value your feedback.  Saralyn Pilar, DO [facility], [place]

## 2021-04-24 NOTE — Progress Notes (Signed)
Subjective:    Patient ID: Ryan Bean, male    DOB: 17-Sep-1965, 56 y.o.   MRN: [id number]  Ryan Bean is a 56 y.o. male presenting on 04/24/2021 for Mass   HPI  Left Groin Cyst Reports new finding, onset about 1 month ago, unsure exact timeline, with a soft fluid filled raised round bump or cyst at top of left inguinal groin region, near his hip. It is non tender, no redness, no other symptoms or other similar raised areas or cysts. He has had no ulceration or drainage. Denies any fevers chills or other general constitutional symptoms.  He needs new cologuard kit ordered.  Depression screen [facility] 2/9 01/16/2021 11/16/2020 02/11/2020  Decreased Interest 3 2 0  Down, Depressed, Hopeless 0 0 0  PHQ - 2 Score 3 2 0  Altered sleeping 3 3 -  Tired, decreased energy 2 2 -  Change in appetite 2 2 -  Feeling bad or failure about yourself  0 0 -  Trouble concentrating 0 0 -  Moving slowly or fidgety/restless 0 0 -  Suicidal thoughts 0 0 -  PHQ-9 Score 10 9 -  Difficult doing work/chores Very difficult Extremely dIfficult -    Social History   Tobacco Use   Smoking status: Every Day    Packs/day: 0.25    Years: 30.00    Pack years: 7.50    Types: Cigarettes   Smokeless tobacco: Current  Substance Use Topics   Alcohol use: Not Currently    Alcohol/week: 2.0 standard drinks    Types: 2 Cans of beer per week    Comment: in past   Drug use: No    Review of Systems Per HPI unless specifically indicated above     Objective:    BP (!) 155/86    Pulse 81    Ht _0  (1.753 m)    Wt 191 lb 9.6 oz (86.9 kg)    SpO2 100%    BMI 28.29 kg/m   Wt Readings from Last 3 Encounters:  04/24/21 191 lb 9.6 oz (86.9 kg)  01/16/21 189 lb 6.4 oz (85.9 kg)  11/16/20 186 lb 12.8 oz (84.7 kg)    Physical Exam Vitals and nursing note reviewed.  Constitutional:      General: He is not in acute distress.    Appearance: Normal appearance. He is well-developed. He is not  diaphoretic.     Comments: Well-appearing, comfortable, cooperative  HENT:     Head: Normocephalic and atraumatic.  Eyes:     General:        Right eye: No discharge.        Left eye: No discharge.     Conjunctiva/sclera: Conjunctivae normal.  Cardiovascular:     Rate and Rhythm: Normal rate.  Pulmonary:     Effort: Pulmonary effort is normal.  Skin:    General: Skin is warm and dry.     Findings: Lesion (soft fluctuant fluid filled cyst left top of groin) present. No erythema or rash.  Neurological:     Mental Status: He is alert and oriented to person, place, and time.  Psychiatric:        Mood and Affect: Mood normal.        Behavior: Behavior normal.        Thought Content: Thought content normal.     Comments: Well groomed, good eye contact, normal speech and thoughts     Left Inginal Cyst    Results  for orders placed or performed in visit on 06/08/20  Hemoglobin A1c  Result Value Ref Range   Hgb A1c MFr Bld 5.1 <5.7 % of total Hgb   Mean Plasma Glucose 100 mg/dL   eAG (mmol/L) 5.5 mmol/L  CBC with Differential/Platelet  Result Value Ref Range   WBC 6.5 3.8 - 10.8 Thousand/uL   RBC 5.27 4.20 - 5.80 Million/uL   Hemoglobin 15.8 13.2 - 17.1 g/dL   HCT 46.6 38.5 - 50.0 %   MCV 88.4 80.0 - 100.0 fL   MCH 30.0 27.0 - 33.0 pg   MCHC 33.9 32.0 - 36.0 g/dL   RDW 13.8 11.0 - 15.0 %   Platelets 239 140 - 400 Thousand/uL   MPV 11.1 7.5 - 12.5 fL   Neutro Abs 3,309 1,500 - 7,800 cells/uL   Lymphs Abs 2,457 850 - 3,900 cells/uL   Absolute Monocytes 481 200 - 950 cells/uL   Eosinophils Absolute 202 15 - 500 cells/uL   Basophils Absolute 52 0 - 200 cells/uL   Neutrophils Relative % 50.9 %   Total Lymphocyte 37.8 %   Monocytes Relative 7.4 %   Eosinophils Relative 3.1 %   Basophils Relative 0.8 %  COMPLETE METABOLIC PANEL WITH GFR  Result Value Ref Range   Glucose, Bld 93 65 - 99 mg/dL   BUN 13 7 - 25 mg/dL   Creat 1.01 0.70 - 1.33 mg/dL   GFR, Est Non African  American 83 > OR = 60 mL/min/1.49m   GFR, Est African American 97 > OR = 60 mL/min/1.751m  BUN/Creatinine Ratio NOT APPLICABLE 6 - 22 (calc)   Sodium 141 135 - 146 mmol/L   Potassium 3.9 3.5 - 5.3 mmol/L   Chloride 105 98 - 110 mmol/L   CO2 26 20 - 32 mmol/L   Calcium 9.6 8.6 - 10.3 mg/dL   Total Protein 7.4 6.1 - 8.1 g/dL   Albumin 4.9 3.6 - 5.1 g/dL   Globulin 2.5 1.9 - 3.7 g/dL (calc)   AG Ratio 2.0 1.0 - 2.5 (calc)   Total Bilirubin 0.6 0.2 - 1.2 mg/dL   Alkaline phosphatase (APISO) 80 35 - 144 U/L   AST 34 10 - 35 U/L   ALT 51 (H) 9 - 46 U/L  Lipid panel  Result Value Ref Range   Cholesterol 209 (H) <200 mg/dL   HDL 48 > OR = 40 mg/dL   Triglycerides 442 (H) <150 mg/dL   LDL Cholesterol (Calc)  mg/dL (calc)   Total CHOL/HDL Ratio 4.4 <5.0 (calc)   Non-HDL Cholesterol (Calc) 161 (H) <130 mg/dL (calc)  PSA  Result Value Ref Range   PSA 0.53 < OR = 4.0 ng/mL  Hepatitis C antibody  Result Value Ref Range   Hepatitis C Ab NON-REACTIVE NON-REACTI   SIGNAL TO CUT-OFF 0.01 <1.00  TSH  Result Value Ref Range   TSH 2.63 0.40 - 4.50 mIU/L      Assessment & Plan:   Problem List Items Addressed This Visit   None Visit Diagnoses     Cyst of skin    -  Primary   Screening for colon cancer       Relevant Orders   Cologuard       Soft fluctuant cyst localized left outer inguinal region No other high risk features or concerns Reassurance Monitor, documented today Follow-up if unresolved consider referral for aspiration or [place] of the defined types were placed in this encounter.  Follow up plan: Return if symptoms worsen or fail to improve, for cyst.   Nobie Putnam, DO [facility] 04/24/2021, 3:03 PM

## 2021-08-01 ENCOUNTER — Telehealth: Payer: Self-pay | Admitting: Pharmacist

## 2021-08-01 NOTE — Telephone Encounter (Signed)
?  Chronic Care Management  ? ?Outreach Note ? ?08/01/2021 ?Name: Ryan Bean MRN: [id number] DOB: October 18, 1965 ? ? ?Patient appearing on report for [place] [facility] Hypertension Control due to last documented ambulatory blood pressure of 155/86 on 04/24/2021. Next appointment with PCP is not currently scheduled  ? ? ?Follow Up Plan: CM Pharmacist will outreach to patient by telephone again within the next 30 days ? ?Wallace Cullens, PharmD, BCACP ?Clinical Pharmacist ?[facility] ?[phone] ? ?

## 2021-08-06 ENCOUNTER — Other Ambulatory Visit: Payer: Self-pay | Admitting: Family Medicine

## 2021-08-06 DIAGNOSIS — I10 Essential (primary) hypertension: Secondary | ICD-10-CM

## 2021-08-06 NOTE — Telephone Encounter (Signed)
Requested Prescriptions  ?Pending Prescriptions Disp Refills  ?? hydrochlorothiazide (HYDRODIURIL) 25 MG tablet [Pharmacy Med Name: HYDROCHLOROTHIAZIDE 25 MG TAB] 90 tablet 0  ?  Sig: TAKE 1 TABLET (25 MG TOTAL) BY MOUTH DAILY.  ?  ? Cardiovascular: Diuretics - Thiazide Failed - 08/06/2021  2:28 AM  ?  ?  Failed - Cr in normal range and within 180 days  ?  Creat  ?Date Value Ref Range Status  ?06/08/2020 1.01 0.70 - 1.33 mg/dL Final  ?  Comment:  ?  For patients >20 years of age, the reference limit ?for Creatinine is approximately 13% higher for people ?identified as African-American. ?. ?  ?   ?  ?  Failed - K in normal range and within 180 days  ?  Potassium  ?Date Value Ref Range Status  ?06/08/2020 3.9 3.5 - 5.3 mmol/L Final  ?   ?  ?  Failed - Na in normal range and within 180 days  ?  Sodium  ?Date Value Ref Range Status  ?06/08/2020 141 135 - 146 mmol/L Final  ?07/13/2015 137 134 - 144 mmol/L Final  ?   ?  ?  Failed - Last BP in normal range  ?  BP Readings from Last 1 Encounters:  ?04/24/21 (!) 155/86  ?   ?  ?  Passed - Valid encounter within last 6 months  ?  Recent Outpatient Visits   ?      ? 3 months ago Cyst of skin  ? [place], DO  ? 6 months ago Essential hypertension  ? [place], DO  ? 8 months ago Chronic left-sided low back pain with left-sided sciatica  ? [place], DO  ? 1 year ago Acute left-sided low back pain with left-sided sciatica  ? [place], DO  ? 1 year ago Acute left-sided low back pain with left-sided sciatica  ? [place], DO  ?  ?  ? ?  ?  ?  ? ? ?

## 2021-09-21 ENCOUNTER — Telehealth: Payer: Self-pay | Admitting: Pharmacist

## 2021-09-21 NOTE — Telephone Encounter (Signed)
  Chronic Care Management    Outreach Note   09/21/2021  Name: Ryan Bean           MRN: [id number]       DOB: 1965-12-13     Patient appearing on report for [place] [employer]ric Hypertension Control due to last documented ambulatory blood pressure of 155/86 on 04/24/2021. Next appointment with PCP is not currently scheduled   Was unable to reach patient via telephone today and have left HIPAA compliant voicemail asking patient to return my call. Outreach attempt #2.    Estelle Grumbles, PharmD, [facility] Clinical Pharmacist [facility] [phone]

## 2021-10-17 ENCOUNTER — Telehealth: Payer: Self-pay

## 2021-10-17 ENCOUNTER — Ambulatory Visit: Payer: Self-pay | Admitting: *Deleted
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
## 2021-10-18 NOTE — Telephone Encounter (Signed)
Agree with advice given

## 2021-10-22 ENCOUNTER — Encounter: Payer: Self-pay | Admitting: Internal Medicine

## 2021-10-22 ENCOUNTER — Ambulatory Visit (INDEPENDENT_AMBULATORY_CARE_PROVIDER_SITE_OTHER): Payer: BC Managed Care – PPO | Admitting: Internal Medicine

## 2021-10-22 VITALS — BP 154/98 | HR 90 | Temp 96.9°F | Wt 184.0 lb

## 2021-10-22 DIAGNOSIS — W868XXD Exposure to other electric current, subsequent encounter: Secondary | ICD-10-CM | POA: Diagnosis not present

## 2021-10-22 DIAGNOSIS — I10 Essential (primary) hypertension: Secondary | ICD-10-CM

## 2021-10-22 DIAGNOSIS — R079 Chest pain, unspecified: Secondary | ICD-10-CM | POA: Diagnosis not present

## 2021-10-22 DIAGNOSIS — T754XXD Electrocution, subsequent encounter: Secondary | ICD-10-CM

## 2021-10-22 MED ORDER — AMLODIPINE BESYLATE 10 MG PO TABS: 10.0000 mg | ORAL_TABLET | Freq: Every day | ORAL | 0 refills | Status: DC

## 2021-10-22 MED ORDER — HYDROCHLOROTHIAZIDE 25 MG PO TABS: 25.0000 mg | ORAL_TABLET | Freq: Every day | ORAL | 0 refills | Status: DC

## 2021-10-22 MED ORDER — LIDOCAINE 5 % EX PTCH: 1.0000 | MEDICATED_PATCH | CUTANEOUS | 5 refills | Status: DC

## 2021-10-22 NOTE — Patient Instructions (Signed)
First-Degree Atrioventricular Block  First-degree atrioventricular (AV) block is a condition that causes the electrical signals that travel from the heart's upper chambers (atria) to its lower chambers (ventricles) to move too slowly. As a result, the heart may beat more slowly than normal. First-degree AV block is the least serious type of heart block. Second- and third-degree AV blocks are more serious. First-degree AV block can increase your risk of developing a type of irregular heartbeat called atrial fibrillation. It is also associated with a higher risk of needing a pacemaker in the future. What are the causes? This condition may be caused by: Any condition that damages the electrical pathway that controls the heart's rate and rhythm, such as a heart attack. Overstimulation of the nerve that slows down the heart rate (vagus nerve). This cause is common among well-conditioned athletes. Some medicines that slow down the heart rate, such as beta blockers or calcium channel blockers. Surgery that damages the heart. Some people are born with this condition (congenital heart block), but most people develop it over time. What increases the risk? The risk for this condition increases with age. You are also more likely to develop this condition if you have: A history of heart attack. Heart failure. Coronary heart disease. Inflammation of heart muscle (myocarditis). Disease of heart muscle (cardiomyopathy). Infection of the heart valves (endocarditis). Infections or diseases that affect the heart. These include: Lyme disease. Sarcoidosis. Hemochromatosis. Rheumatic fever. Certain muscle disorders. Babies are more likely to be born with heart block if: The baby's mother has an autoimmune disease, such as lupus. The baby is born with a heart defect that affects the heart's structure. A parent was born with a heart defect. What are the signs or symptoms? This condition usually does not cause  any symptoms. How is this diagnosed? This condition may be diagnosed based on: A physical exam. Your medical history. A measurement of your pulse or heartbeat. Tests. These may include: An electrocardiogram (ECG). This checks for problems with electrical activity in the heart. Ambulatory cardiac monitoring. This is a portable ECG that you wear. It checks your heart's rhythm. An electrophysiology (EP) study. Long, thin tubes (catheters) are placed in your heart. The catheters give information about your heart's electrical signals. How is this treated? Usually, treatment is not needed for this condition. In some cases, treatment involves: Treating an underlying condition, such as heart disease. Changing or stopping any heart medicines that can cause heart block. Follow these instructions at home: Alcohol use Do not drink alcohol if: Your health care provider tells you not to drink. You are pregnant, may be pregnant, or are planning to become pregnant. If you drink alcohol: Limit how much you use to: 0-1 drink a day for women. 0-2 drinks a day for men. Be aware of how much alcohol is in your drink. In the [place] one drink equals one 12 oz bottle of beer (355 mL), one 5 oz glass of wine (148 mL), or one 1 oz glass of hard liquor (44 mL). General instructions  Take over-the-counter and prescription medicines only as told by your health care provider. Follow your health care provider's recommendations to help reduce your risk of heart disease. These may include: Exercising at least 30 minutes on 5 or more days each week (150 minutes). Ask your health care provider what type of exercise is safe for you. Eating a heart-healthy diet with fruits and vegetables, whole grains, low-fat dairy products, and lean proteins like poultry and eggs. Your health  care provider or dietitian can help you make healthy choices. Maintaining a healthy weight. Do not use any products that contain nicotine or  tobacco, such as cigarettes, e-cigarettes, and chewing tobacco. If you need help quitting, ask your health care provider. Keep all follow-up visits as told by your health care provider. This is important. Where to find more information American Heart Association: www.heart.org National Heart, Lung, and Blood Institute: PopSteam.is Contact a health care provider if you: Feel like your heart is skipping beats. Feel more tired than normal. Have swelling in your hands, feet, or lower legs. Get help right away if you: Have symptoms that change or get worse. Develop new symptoms. Have chest pain, especially if the pain: Feels like crushing or pressure. Spreads to your arms, back, neck, or jaw. Feel short of breath. Feel light-headed or weak. Faint. These symptoms may represent a serious problem that is an emergency. Do not wait to see if the symptoms will go away. Get medical help right away. Call your local emergency services (911 in the [place] Do not drive yourself to the hospital. Summary First-degree atrioventricular (AV) block is the least serious type of heart block. In this condition, the signals that control heart rate move too slowly. As a result, the heart may beat more slowly than normal. Usually, treatment is not needed for this condition. In some cases, you may need to change or stop medicines that may be making the condition worse. Healthy lifestyle choices such as exercising regularly, eating a healthy diet, and limiting alcohol are good for your heart. This information is not intended to replace advice given to you by your health care provider. Make sure you discuss any questions you have with your health care provider. Document Revised: 02/15/2019 Document Reviewed: 02/15/2019 Elsevier Patient Education  2023 [employer].
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
## 2021-10-31 ENCOUNTER — Ambulatory Visit: Payer: BC Managed Care – PPO | Admitting: Family Medicine

## 2021-11-06 ENCOUNTER — Other Ambulatory Visit: Payer: Self-pay | Admitting: Family Medicine

## 2021-11-06 DIAGNOSIS — I10 Essential (primary) hypertension: Secondary | ICD-10-CM

## 2021-11-07 ENCOUNTER — Telehealth: Payer: Self-pay

## 2021-11-07 NOTE — Telephone Encounter (Signed)
Copied from CRM [id number]. Topic: Referral - Status >> Nov 05, 2021 12:32 PM Macon Large wrote: Reason for CRM: Pt requests update on referral for Cardiology because he has not been contacted. Cb# [phone]  I just called the patient.  No answer and I left a message.   It says in the referral that the heart center has tried to call him several time and they have had to leave messages as well.   If he calls back, he needs to call them to check on the referral.
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
## 2021-12-03 ENCOUNTER — Telehealth: Payer: Self-pay | Admitting: Pharmacist

## 2021-12-03 NOTE — Telephone Encounter (Signed)
  Chronic Care Management    Outreach Note   12/03/21  Name: Ryan Bean           MRN: [id number]       DOB: 09/07/1965     Patient appearing on report for [place] [employer] Hypertension Control due to last documented ambulatory blood pressure of 155/86 on 04/24/2021. Next appointment with PCP is not currently scheduled   Was unable to reach patient via telephone today and have left HIPAA compliant voicemail asking patient to return my call.     Estelle Grumbles, PharmD, [facility] Clinical Pharmacist [facility] Care Management [phone]

## 2022-01-10 ENCOUNTER — Other Ambulatory Visit: Payer: Self-pay | Admitting: Internal Medicine

## 2022-01-10 DIAGNOSIS — I10 Essential (primary) hypertension: Secondary | ICD-10-CM
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
## 2022-02-08 ENCOUNTER — Other Ambulatory Visit: Payer: Self-pay | Admitting: Internal Medicine

## 2022-02-08 DIAGNOSIS — I10 Essential (primary) hypertension: Secondary | ICD-10-CM
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
## 2022-02-11 ENCOUNTER — Other Ambulatory Visit
Admission: RE | Admit: 2022-02-11 | Discharge: 2022-02-11 | Disposition: A | Discharge status: Home / Self Care | Payer: BC Managed Care – PPO | Source: Ambulatory Visit | Attending: Cardiology | Admitting: Cardiology

## 2022-02-11 ENCOUNTER — Ambulatory Visit: Payer: BC Managed Care – PPO | Attending: Cardiology | Admitting: Cardiology

## 2022-02-11 ENCOUNTER — Encounter: Payer: Self-pay | Admitting: Cardiology

## 2022-02-11 VITALS — BP 140/92 | HR 91 | Ht 68.0 in | Wt 187.8 lb

## 2022-02-11 DIAGNOSIS — R072 Precordial pain: Secondary | ICD-10-CM | POA: Diagnosis not present

## 2022-02-11 DIAGNOSIS — E782 Mixed hyperlipidemia: Secondary | ICD-10-CM

## 2022-02-11 DIAGNOSIS — F17200 Nicotine dependence, unspecified, uncomplicated: Secondary | ICD-10-CM | POA: Diagnosis not present

## 2022-02-11 DIAGNOSIS — I10 Essential (primary) hypertension: Secondary | ICD-10-CM

## 2022-02-11 LAB — BASIC METABOLIC PANEL
Anion gap: 12 (ref 5–15)
BUN: 15 mg/dL (ref 6–20)
CO2: 23 mmol/L (ref 22–32)
Calcium: 9.5 mg/dL (ref 8.9–10.3)
Chloride: 105 mmol/L (ref 98–111)
Creatinine, Ser: 1.01 mg/dL (ref 0.61–1.24)
GFR, Estimated: >60 mL/min (ref >60)
Glucose, Bld: 97 mg/dL (ref 70–99)
Potassium: 3.3 mmol/L — ABNORMAL LOW (ref 3.5–5.1)
Sodium: 140 mmol/L (ref 135–145)

## 2022-02-11 LAB — LIPID PANEL
Cholesterol: 264 mg/dL — ABNORMAL HIGH (ref 0–200)
HDL: 56 mg/dL (ref >40)
LDL Cholesterol: 145 mg/dL — ABNORMAL HIGH (ref 0–99)
Total CHOL/HDL Ratio: 4.7 RATIO
Triglycerides: 314 mg/dL — ABNORMAL HIGH (ref <150)
VLDL: 63 mg/dL — ABNORMAL HIGH (ref 0–40)

## 2022-02-11 MED ORDER — LOSARTAN POTASSIUM 25 MG PO TABS: 25.0000 mg | ORAL_TABLET | Freq: Every day | ORAL | 3 refills | Status: DC

## 2022-02-11 MED ORDER — METOPROLOL TARTRATE 100 MG PO TABS: 100.0000 mg | ORAL_TABLET | Freq: Once | ORAL | 0 refills | Status: DC

## 2022-02-11 NOTE — Patient Instructions (Signed)
Medication Instructions:   Your physician has recommended you make the following change in your medication:    START taking Losartan 25 MG once a day.  *If you need a refill on your cardiac medications before your next appointment, please call your pharmacy*   Lab Work:  Please go to the [facility] after your appointment today for a BMP and a Fasting  Lipid Lab draw.   Testing/Procedures:  Your physician has requested that you have an echocardiogram. Echocardiography is a painless test that uses sound waves to create images of your heart. It provides your doctor with information about the size and shape of your heart and how well your heart's chambers and valves are working. This procedure takes approximately one hour. There are no restrictions for this procedure. Please do NOT wear cologne, perfume, aftershave, or lotions (deodorant is allowed). Please arrive 15 minutes prior to your appointment time.   2.   Your physician has requested that you have cardiac CT. Cardiac computed tomography (CT) is a painless test that uses an x-ray machine to take clear, detailed pictures of your heart.    Your cardiac CT will be scheduled at:   [facility] [address] [place], [place] [address] [phone]   Monday 02/25/22 at 8:45    Please arrive 15 mins early for check-in and test prep.    Please follow these instructions carefully (unless otherwise directed):   Hold all erectile dysfunction medications at least 3 days (72 hrs) prior to test. (Ie viagra, cialis, sildenafil, tadalafil, etc) We will administer nitroglycerin during this exam.    Night Before the Test: Be sure to Drink plenty of water. Do not consume any caffeinated/decaffeinated beverages or chocolate 12 hours prior to your test.   On the Day of the Test: Drink plenty of water until 1 hour prior to the test. Do not eat any food 4 hours prior to the test. You may take your regular  medications prior to the test.  Take metoprolol (Lopressor) 100 MG two hours prior to test. Take Ivabradine (Corlanor) 15 MG two hours prior to test. DO NOT TAKE your Hydrochlorothiazide morning of the test.       After the Test: Drink plenty of water. After receiving IV contrast, you may experience a mild flushed feeling. This is normal. On occasion, you may experience a mild rash up to 24 hours after the test. This is not dangerous. If this occurs, you can take Benadryl 25 mg and increase your fluid intake. If you experience trouble breathing, this can be serious. If it is severe call 911 IMMEDIATELY. If it is mild, please call our office. If you take any of these medications: Glipizide/Metformin, Avandament, Glucavance, please do not take 48 hours after completing test unless otherwise instructed.  Please allow 2-4 weeks for scheduling of routine cardiac CTs. Some insurance companies require a pre-authorization which may delay scheduling of this test.   For non-scheduling related questions, please contact the cardiac imaging nurse navigator should you have any questions/concerns: [place]well [place], Cardiac Imaging Nurse Navigator Larey Brick, Cardiac Imaging Nurse Navigator [facility] Direct Office Dial: [phone]   For scheduling needs, including cancellations and rescheduling, please call [place], [phone].    Follow-Up: At [facility], you and your health needs are our priority.  As part of our continuing mission to provide you with exceptional heart care, we have created designated Provider Care Teams.  These Care Teams include your  primary Cardiologist (physician) and Advanced Practice Providers (APPs -  Physician Assistants and Nurse Practitioners) who all work together to provide you with the care you need, when you need it.  We recommend signing up for the patient portal called "MyChart".  Sign up information is provided on this After  Visit Summary.  MyChart is used to connect with patients for Virtual Visits (Telemedicine).  Patients are able to view lab/test results, encounter notes, upcoming appointments, etc.  Non-urgent messages can be sent to your provider as well.   To learn more about what you can do with MyChart, go to [email or web address].    Your next appointment:   Follow up after testing   The format for your next appointment:   In Person  Provider:   You may see Kate Sable, MD or one of the following Advanced Practice Providers on your designated Care Team:   Murray Hodgkins, NP Christell Faith, PA-C [place] Kathlen Mody, PA-C Gerrie Nordmann, NP    Other Instructions   Important Information About Sugar

## 2022-02-11 NOTE — Progress Notes (Signed)
Cardiology Office Note:    Date:  02/11/2022   ID:  Cordelia Poche, DOB [birth year], MRN [id number]  PCP:  Smitty Cords, DO   [facility] Providers Cardiologist:  Debbe Odea, MD     Referring MD: Lorre Munroe, NP   Chief Complaint  Patient presents with   New Patient (Initial Visit)    Chest pain, random chest pain,left arm and left leg pain   Ryan Bean is a 56 y.o. male who is being seen today for the evaluation of chest pain at the request of Lorre Munroe, NP.   History of Present Illness:    Ryan Bean is a 56 y.o. male with a hx of hypertension, hyperlipidemia, current smoker x30+ years who presents due to chest pain.  Symptoms of chest pain began 6 months ago after being tased several times by LAD whom he was trying to move away from.  Since then, he has had occasional left-sided chest pain with radiation down his left arm.  Takes BP medications as prescribed, blood pressures at home in the 160s systolic.  He denies any family history of heart disease.  Past Medical History:  Diagnosis Date   Allergy    Arthritis    Hypertension     History reviewed. No pertinent surgical history.  Current Medications: Current Meds  Medication Sig   amLODipine (NORVASC) 10 MG tablet TAKE 1 TABLET BY MOUTH EVERY DAY   aspirin EC 81 MG tablet Take 1 tablet (81 mg total) by mouth daily.   hydrochlorothiazide (HYDRODIURIL) 25 MG tablet TAKE 1 TABLET (25 MG TOTAL) BY MOUTH DAILY.   ibuprofen (ADVIL) 600 MG tablet Take 600 mg by mouth every 6 (six) hours as needed.   lidocaine (LIDODERM) 5 % Place 1 patch onto the skin daily. Remove & Discard patch within 12 hours or as directed   losartan (COZAAR) 25 MG tablet Take 1 tablet (25 mg total) by mouth daily.   metoprolol tartrate (LOPRESSOR) 100 MG tablet Take 1 tablet (100 mg total) by mouth once for 1 dose. Take 2 hours prior to your CT scan.     Allergies:   Patient has no  known allergies.   Social History   Socioeconomic History   Marital status: Married    Spouse name: Not on file   Number of children: Not on file   Years of education: Not on file   Highest education level: Not on file  Occupational History   Not on file  Tobacco Use   Smoking status: Every Day    Packs/day: 0.25    Years: 30.00    Total pack years: 7.50    Types: Cigarettes   Smokeless tobacco: Current  Substance and Sexual Activity   Alcohol use: Yes    Alcohol/week: 2.0 standard drinks of alcohol    Types: 2 Cans of beer per week    Comment: occasional   Drug use: No   Sexual activity: Not on file  Other Topics Concern   Not on file  Social History Narrative   Not on file   Social Determinants of Health   Financial Resource Strain: Not on file  Food Insecurity: Not on file  Transportation Needs: Not on file  Physical Activity: Not on file  Stress: Not on file  Social Connections: Not on file     Family History: The patient's family history includes Hypertension in his father and mother. There is no history of Colon cancer.  ROS:   Please see the history of present illness.     All other systems reviewed and are negative.  EKGs/Labs/Other Studies Reviewed:    The following studies were reviewed today:   EKG:  EKG is  ordered today.  The ekg ordered today demonstrates sinus rhythm, first-degree AV block, otherwise normal ECG, heart rate 91  Recent Labs: No results found for requested labs within last 365 days.  Recent Lipid Panel    Component Value Date/Time   CHOL 209 (H) 06/08/2020 0951   CHOL 229 (H) 08/03/2015 0826   TRIG 442 (H) 06/08/2020 0951   HDL 48 06/08/2020 0951   HDL 32 (L) 08/03/2015 0826   CHOLHDL 4.4 06/08/2020 0951   VLDL NOT CALC 12/16/2016 0819   LDLCALC  06/08/2020 0951     Comment:     . LDL cholesterol not calculated. Triglyceride levels greater than 400 mg/dL invalidate calculated LDL results. . Reference range:  <100 . Desirable range <100 mg/dL for primary prevention;   <70 mg/dL for patients with CHD or diabetic patients  with > or = 2 CHD risk factors. Marland Kitchen LDL-C is now calculated using the Martin-Hopkins  calculation, which is a validated novel method providing  better accuracy than the Friedewald equation in the  estimation of LDL-C.  Horald Pollen et al. Lenox Ahr. 4098;119(14): 2061-2068  (http://education.[employer].com/faq/FAQ164)      Risk Assessment/Calculations:     HYPERTENSION CONTROL Vitals:   02/11/22 0835 02/11/22 0842  BP: (!) 140/92 (!) 140/92    The patient's blood pressure is elevated above target today.  In order to address the patient's elevated BP: A new medication was prescribed today.            Physical Exam:    VS:  BP (!) 140/92 (BP Location: Right Arm)   Pulse 91   Ht 5\' 8"  (1.727 m)   Wt 187 lb 12.8 oz (85.2 kg)   SpO2 96%   BMI 28.55 kg/m     Wt Readings from Last 3 Encounters:  02/11/22 187 lb 12.8 oz (85.2 kg)  10/22/21 184 lb (83.5 kg)  04/24/21 191 lb 9.6 oz (86.9 kg)     GEN:  Well nourished, well developed in no acute distress HEENT: Normal NECK: No JVD; No carotid bruits CARDIAC: RRR, no murmurs, rubs, gallops RESPIRATORY:  Clear to auscultation without rales, wheezing or rhonchi  ABDOMEN: Soft, non-tender, non-distended MUSCULOSKELETAL:  No edema; No deformity  SKIN: Warm and dry NEUROLOGIC:  Alert and oriented x 3 PSYCHIATRIC:  Normal affect   ASSESSMENT:    1. Precordial pain   2. Primary hypertension   3. Mixed hyperlipidemia   4. Smoking    PLAN:    In order of problems listed above:  Chest pain, risk factors hypertension, current smoker, hyperlipidemia.  Get echocardiogram, get coronary CTA to evaluate presence of CAD. Hypertension, BP elevated, start losartan 25 mg daily, continue HCTZ 25, Norvasc 10 mg. History of hyperlipidemia, obtain fasting lipid profile.  Plan to start statin based on CTA findings and lipid  results. Current smoker, smoking cessation advised.  Follow-up after echo and coronary CTA.     Medication Adjustments/Labs and Tests Ordered: Current medicines are reviewed at length with the patient today.  Concerns regarding medicines are outlined above.  Orders Placed This Encounter  Procedures   CT CORONARY MORPH W/CTA COR W/SCORE W/CA W/CM &/OR WO/CM   Basic metabolic panel   Lipid panel   EKG 12-Lead   ECHOCARDIOGRAM  COMPLETE   Meds ordered this encounter  Medications   losartan (COZAAR) 25 MG tablet    Sig: Take 1 tablet (25 mg total) by mouth daily.    Dispense:  30 tablet    Refill:  3   metoprolol tartrate (LOPRESSOR) 100 MG tablet    Sig: Take 1 tablet (100 mg total) by mouth once for 1 dose. Take 2 hours prior to your CT scan.    Dispense:  1 tablet    Refill:  0    Patient Instructions  Medication Instructions:   Your physician has recommended you make the following change in your medication:    START taking Losartan 25 MG once a day.  *If you need a refill on your cardiac medications before your next appointment, please call your pharmacy*   Lab Work:  Please go to the [facility] after your appointment today for a BMP and a Fasting  Lipid Lab draw.   Testing/Procedures:  Your physician has requested that you have an echocardiogram. Echocardiography is a painless test that uses sound waves to create images of your heart. It provides your doctor with information about the size and shape of your heart and how well your heart's chambers and valves are working. This procedure takes approximately one hour. There are no restrictions for this procedure. Please do NOT wear cologne, perfume, aftershave, or lotions (deodorant is allowed). Please arrive 15 minutes prior to your appointment time.   2.   Your physician has requested that you have cardiac CT. Cardiac computed tomography (CT) is a painless test that uses an x-ray machine to take clear, detailed  pictures of your heart.    Your cardiac CT will be scheduled at:   [facility] [address] [place], [place] [address] [phone]   Monday 02/25/22 at 8:45    Please arrive 15 mins early for check-in and test prep.    Please follow these instructions carefully (unless otherwise directed):   Hold all erectile dysfunction medications at least 3 days (72 hrs) prior to test. (Ie viagra, cialis, sildenafil, tadalafil, etc) We will administer nitroglycerin during this exam.    Night Before the Test: Be sure to Drink plenty of water. Do not consume any caffeinated/decaffeinated beverages or chocolate 12 hours prior to your test.   On the Day of the Test: Drink plenty of water until 1 hour prior to the test. Do not eat any food 4 hours prior to the test. You may take your regular medications prior to the test.  Take metoprolol (Lopressor) 100 MG two hours prior to test. Take Ivabradine (Corlanor) 15 MG two hours prior to test. DO NOT TAKE your Hydrochlorothiazide morning of the test.       After the Test: Drink plenty of water. After receiving IV contrast, you may experience a mild flushed feeling. This is normal. On occasion, you may experience a mild rash up to 24 hours after the test. This is not dangerous. If this occurs, you can take Benadryl 25 mg and increase your fluid intake. If you experience trouble breathing, this can be serious. If it is severe call 911 IMMEDIATELY. If it is mild, please call our office. If you take any of these medications: Glipizide/Metformin, Avandament, Glucavance, please do not take 48 hours after completing test unless otherwise instructed.  Please allow 2-4 weeks for scheduling of routine cardiac CTs. Some insurance companies require a pre-authorization which may delay scheduling of this test.   For  non-scheduling related questions, please contact the cardiac imaging nurse navigator should you have any  questions/concerns: Marchia Bond, Cardiac Imaging Nurse Navigator Gordy Clement, Cardiac Imaging Nurse Navigator [facility] Direct Office Dial: [phone]   For scheduling needs, including cancellations and rescheduling, please call [place], [phone].    Follow-Up: At [facility], you and your health needs are our priority.  As part of our continuing mission to provide you with exceptional heart care, we have created designated Provider Care Teams.  These Care Teams include your primary Cardiologist (physician) and Advanced Practice Providers (APPs -  Physician Assistants and Nurse Practitioners) who all work together to provide you with the care you need, when you need it.  We recommend signing up for the patient portal called "MyChart".  Sign up information is provided on this After Visit Summary.  MyChart is used to connect with patients for Virtual Visits (Telemedicine).  Patients are able to view lab/test results, encounter notes, upcoming appointments, etc.  Non-urgent messages can be sent to your provider as well.   To learn more about what you can do with MyChart, go to [email or web address].    Your next appointment:   Follow up after testing   The format for your next appointment:   In Person  Provider:   You may see Kate Sable, MD or one of the following Advanced Practice Providers on your designated Care Team:   Murray Hodgkins, NP Christell Faith, PA-C Cadence Kathlen Mody, PA-C Gerrie Nordmann, NP    Other Instructions   Important Information About Sugar        Signed, Kate Sable, MD  02/11/2022 10:20 AM    [place]

## 2022-02-12 ENCOUNTER — Telehealth: Payer: Self-pay

## 2022-02-12 MED ORDER — ROSUVASTATIN CALCIUM 40 MG PO TABS: 40.0000 mg | ORAL_TABLET | Freq: Every day | ORAL | 3 refills | Status: DC

## 2022-02-12 NOTE — Telephone Encounter (Signed)
-----   Message from Kate Sable, MD sent at 02/12/2022 11:02 AM EDT ----- Cholesterol  elevated, start Crestor 40 mg daily.

## 2022-02-12 NOTE — Telephone Encounter (Signed)
Called patient and left a detailed VM per DPR on file. Sent prescription into patients pharmacy. Encouraged patient to call back with any questions or concerns.

## 2022-02-21 ENCOUNTER — Telehealth (HOSPITAL_COMMUNITY): Payer: Self-pay | Admitting: Emergency Medicine

## 2022-02-21 NOTE — Telephone Encounter (Signed)
Attempted to call patient regarding upcoming cardiac CT appointment. °Left message on voicemail with name and callback number °Mieko Kneebone RN Navigator Cardiac Imaging °[facility] °[phone] Office °[phone] Cell ° °

## 2022-02-25 ENCOUNTER — Ambulatory Visit
Admission: RE | Admit: 2022-02-25 | Discharge: 2022-02-25 | Disposition: A | Discharge status: Home / Self Care | Payer: BC Managed Care – PPO | Source: Ambulatory Visit | Attending: Cardiology | Admitting: Cardiology

## 2022-02-25 DIAGNOSIS — R072 Precordial pain: Secondary | ICD-10-CM | POA: Diagnosis not present

## 2022-02-25 MED ORDER — METOPROLOL TARTRATE 5 MG/5ML IV SOLN: 10.0000 mg | Freq: Once | INTRAVENOUS | Status: AC

## 2022-02-25 MED ORDER — METOPROLOL TARTRATE 5 MG/5ML IV SOLN
INTRAVENOUS | Status: AC
  Administered 2022-02-25: 10 mg via INTRAVENOUS
  Filled 2022-02-25: qty 10

## 2022-02-25 MED ORDER — METOPROLOL TARTRATE 5 MG/5ML IV SOLN
INTRAVENOUS | Status: AC
  Filled 2022-02-25: qty 10

## 2022-02-25 MED ORDER — NITROGLYCERIN 0.4 MG SL SUBL
0.8000 mg | SUBLINGUAL_TABLET | Freq: Once | SUBLINGUAL | Status: AC
  Administered 2022-02-25: 0.8 mg via SUBLINGUAL
  Filled 2022-02-25: qty 25

## 2022-02-25 MED ORDER — METOPROLOL TARTRATE 5 MG/5ML IV SOLN
10.0000 mg | Freq: Once | INTRAVENOUS | Status: AC
  Administered 2022-02-25: 10 mg via INTRAVENOUS

## 2022-02-25 MED ORDER — IOHEXOL 350 MG/ML SOLN
100.0000 mL | Freq: Once | INTRAVENOUS | Status: AC | PRN
  Administered 2022-02-25: 100 mL via INTRAVENOUS

## 2022-02-25 NOTE — Progress Notes (Signed)
Patient tolerated procedure well. Ambulate w/o difficulty. Denies any lightheadedness or being dizzy. Pt denies any pain at this time. Sitting in chair, pt is encouraged to drink additional water throughout the day and reason explained to patient. Patient verbalized understanding and all questions answered. ABC intact. No further needs at this time. Discharge from procedure area w/o issues.  

## 2022-02-28 ENCOUNTER — Other Ambulatory Visit: Payer: BLUE CROSS/BLUE SHIELD

## 2022-03-05 ENCOUNTER — Ambulatory Visit: Payer: BC Managed Care – PPO | Attending: Cardiology

## 2022-03-07 ENCOUNTER — Ambulatory Visit: Payer: BC Managed Care – PPO | Attending: Cardiology | Admitting: Cardiology

## 2022-03-08 ENCOUNTER — Other Ambulatory Visit: Payer: Self-pay

## 2022-03-08 ENCOUNTER — Encounter: Payer: Self-pay | Admitting: Cardiology

## 2022-03-08 MED ORDER — LOSARTAN POTASSIUM 25 MG PO TABS: 25.0000 mg | ORAL_TABLET | Freq: Every day | ORAL | 3 refills | Status: DC

## 2022-03-13 ENCOUNTER — Other Ambulatory Visit: Payer: Self-pay

## 2022-03-13 MED ORDER — ROSUVASTATIN CALCIUM 40 MG PO TABS: 40.0000 mg | ORAL_TABLET | Freq: Every day | ORAL | 3 refills | Status: DC

## 2022-04-02 ENCOUNTER — Other Ambulatory Visit: Payer: Self-pay | Admitting: Family Medicine

## 2022-04-02 DIAGNOSIS — I10 Essential (primary) hypertension: Secondary | ICD-10-CM

## 2022-04-23 ENCOUNTER — Encounter: Payer: BC Managed Care – PPO | Admitting: Family Medicine

## 2022-05-02 ENCOUNTER — Encounter: Payer: BC Managed Care – PPO | Admitting: Family Medicine

## 2022-05-10 ENCOUNTER — Encounter: Payer: BC Managed Care – PPO | Admitting: Family Medicine

## 2022-06-04 ENCOUNTER — Encounter: Payer: BC Managed Care – PPO | Admitting: Family Medicine

## 2022-06-25 ENCOUNTER — Encounter: Payer: Self-pay | Admitting: Family Medicine

## 2022-06-25 ENCOUNTER — Ambulatory Visit (INDEPENDENT_AMBULATORY_CARE_PROVIDER_SITE_OTHER): Payer: BC Managed Care – PPO | Admitting: Family Medicine

## 2022-06-25 VITALS — BP 142/88 | HR 110 | Ht 68.0 in | Wt 186.0 lb

## 2022-06-25 DIAGNOSIS — Z125 Encounter for screening for malignant neoplasm of prostate: Secondary | ICD-10-CM

## 2022-06-25 DIAGNOSIS — Z0000 Encounter for general adult medical examination without abnormal findings: Secondary | ICD-10-CM

## 2022-06-25 DIAGNOSIS — E782 Mixed hyperlipidemia: Secondary | ICD-10-CM

## 2022-06-25 DIAGNOSIS — M62830 Muscle spasm of back: Secondary | ICD-10-CM

## 2022-06-25 DIAGNOSIS — M5442 Lumbago with sciatica, left side: Secondary | ICD-10-CM

## 2022-06-25 DIAGNOSIS — I10 Essential (primary) hypertension: Secondary | ICD-10-CM

## 2022-06-25 DIAGNOSIS — Z1211 Encounter for screening for malignant neoplasm of colon: Secondary | ICD-10-CM

## 2022-06-25 DIAGNOSIS — R7309 Other abnormal glucose: Secondary | ICD-10-CM | POA: Diagnosis not present

## 2022-06-25 DIAGNOSIS — Z6828 Body mass index (BMI) 28.0-28.9, adult: Secondary | ICD-10-CM

## 2022-06-25 DIAGNOSIS — G8929 Other chronic pain: Secondary | ICD-10-CM

## 2022-06-25 MED ORDER — HYDROCHLOROTHIAZIDE 25 MG PO TABS: 25.0000 mg | ORAL_TABLET | Freq: Every day | ORAL | 0 refills | Status: DC

## 2022-06-25 MED ORDER — AMLODIPINE BESYLATE 10 MG PO TABS: 10.0000 mg | ORAL_TABLET | Freq: Every day | ORAL | 0 refills | Status: DC

## 2022-06-25 MED ORDER — LOSARTAN POTASSIUM 25 MG PO TABS: 25.0000 mg | ORAL_TABLET | Freq: Every day | ORAL | 3 refills | Status: DC

## 2022-06-25 MED ORDER — METHOCARBAMOL 500 MG PO TABS: 500.0000 mg | ORAL_TABLET | Freq: Three times a day (TID) | ORAL | 3 refills | Status: DC | PRN

## 2022-06-25 MED ORDER — ROSUVASTATIN CALCIUM 40 MG PO TABS: 40.0000 mg | ORAL_TABLET | Freq: Every day | ORAL | 3 refills | Status: DC

## 2022-06-25 NOTE — Patient Instructions (Addendum)
Thank you for coming to the office today.  Labs ordered today, stay tuned for results on MyChart.  Re ordered BP medications  Amlodipine '10mg'$  daily Hydrochlorothiazide HCTZ '25mg'$  daily Losartan '25mg'$  daily  Keep track of BP if you can monitor it outside the office.  Goal < 140 /90  If still elevated or if too low or other issues, please call or come back sooner.  ------------   Ordered the Cologuard (home kit) test for colon cancer screening. Stay tuned for further updates.  It will be shipped to you directly. If not received in 2-4 weeks, call us or the company.   If you send it back and no results are received in 2-4 weeks, call us or the company as well!   Colon Cancer Screening: - For all adults age 18+ routine colon cancer screening is highly recommended.     - Recent guidelines from [place] recommend starting age of 17 - Early detection of colon cancer is important, because often there are no warning signs or symptoms, also if found early usually it can be cured. Late stage is hard to treat.   - If Cologuard is NEGATIVE, then it is good for 3 years before next due - If Cologuard is POSITIVE, then it is strongly advised to get a Colonoscopy, which allows the GI doctor to locate the source of the cancer or polyp (even very early stage) and treat it by removing it. ------------------------- Follow instructions to collect sample, you may call the company for any help or questions, 24/7 telephone support at [phone].  DUE for FASTING BLOOD WORK (no food or drink after midnight before the lab appointment, only water or coffee without cream/sugar on the morning of)  SCHEDULE "Lab Only" visit in the morning at the clinic for lab draw in 6 MONTHS   - Make sure Lab Only appointment is at about 1 week before your next appointment, so that results will be available  For Lab Results, once available within 2-3 days of blood draw, you can can log in to MyChart  online to view your results and a brief explanation. Also, we can discuss results at next follow-up visit.   Please schedule a Follow-up Appointment to: Return in about 6 months (around 12/26/2022) for 6 month fasting lab only then 1 week later Follow-up HTN HLD, meds.  If you have any other questions or concerns, please feel free to call the office or send a message through [place]. You may also schedule an earlier appointment if necessary.  Additionally, you may be receiving a survey about your experience at our office within a few days to 1 week by e-mail or mail. We value your feedback.  Nobie Putnam, DO [place]
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
## 2022-06-26 LAB — COMPLETE METABOLIC PANEL WITH GFR
AG Ratio: 1.5 (calc) (ref 1.0–2.5)
ALT: 106 U/L — ABNORMAL HIGH (ref 9–46)
AST: 100 U/L — ABNORMAL HIGH (ref 10–35)
Albumin: 4.4 g/dL (ref 3.6–5.1)
Alkaline phosphatase (APISO): 144 U/L (ref 35–144)
BUN: 11 mg/dL (ref 7–25)
CO2: 27 mmol/L (ref 20–32)
Calcium: 9.3 mg/dL (ref 8.6–10.3)
Chloride: 101 mmol/L (ref 98–110)
Creat: 1.18 mg/dL (ref 0.70–1.30)
Globulin: 2.9 g/dL (calc) (ref 1.9–3.7)
Glucose, Bld: 94 mg/dL (ref 65–99)
Potassium: 3.5 mmol/L (ref 3.5–5.3)
Sodium: 143 mmol/L (ref 135–146)
Total Bilirubin: 0.9 mg/dL (ref 0.2–1.2)
Total Protein: 7.3 g/dL (ref 6.1–8.1)
eGFR: 72 mL/min/{1.73_m2} (ref >=60)

## 2022-06-26 LAB — CBC WITH DIFFERENTIAL/PLATELET
Absolute Monocytes: 550 cells/uL (ref 200–950)
Basophils Absolute: 70 cells/uL (ref 0–200)
Basophils Relative: 1.1 %
Eosinophils Absolute: 38 cells/uL (ref 15–500)
Eosinophils Relative: 0.6 %
HCT: 40.7 % (ref 38.5–50.0)
Hemoglobin: 14 g/dL (ref 13.2–17.1)
Lymphs Abs: 1530 cells/uL (ref 850–3900)
MCH: 30.7 pg (ref 27.0–33.0)
MCHC: 34.4 g/dL (ref 32.0–36.0)
MCV: 89.3 fL (ref 80.0–100.0)
MPV: 10.2 fL (ref 7.5–12.5)
Monocytes Relative: 8.6 %
Neutro Abs: 4211 cells/uL (ref 1500–7800)
Neutrophils Relative %: 65.8 %
Platelets: 276 10*3/uL (ref 140–400)
RBC: 4.56 10*6/uL (ref 4.20–5.80)
RDW: 15.5 % — ABNORMAL HIGH (ref 11.0–15.0)
Total Lymphocyte: 23.9 %
WBC: 6.4 10*3/uL (ref 3.8–10.8)

## 2022-06-26 LAB — LIPID PANEL
Cholesterol: 352 mg/dL — ABNORMAL HIGH (ref <200)
HDL: 64 mg/dL (ref >=40)
LDL Cholesterol (Calc): 252 mg/dL (calc) — ABNORMAL HIGH
Non-HDL Cholesterol (Calc): 288 mg/dL (calc) — ABNORMAL HIGH (ref <130)
Total CHOL/HDL Ratio: 5.5 (calc) — ABNORMAL HIGH (ref <5.0)
Triglycerides: 182 mg/dL — ABNORMAL HIGH (ref <150)

## 2022-06-26 LAB — HEMOGLOBIN A1C
Hgb A1c MFr Bld: 5.7 % of total Hgb — ABNORMAL HIGH (ref <5.7)
Mean Plasma Glucose: 117 mg/dL
eAG (mmol/L): 6.5 mmol/L

## 2022-06-26 LAB — PSA: PSA: 0.49 ng/mL (ref <=4.00)

## 2022-06-26 LAB — TSH: TSH: 0.93 mIU/L (ref 0.40–4.50)

## 2022-06-28 ENCOUNTER — Encounter: Payer: Self-pay | Admitting: Family Medicine

## 2022-09-20 ENCOUNTER — Other Ambulatory Visit: Payer: Self-pay | Admitting: Family Medicine

## 2022-09-20 DIAGNOSIS — I10 Essential (primary) hypertension: Secondary | ICD-10-CM
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
## 2022-12-18 ENCOUNTER — Ambulatory Visit: Payer: BC Managed Care – PPO | Admitting: Family Medicine

## 2022-12-27 ENCOUNTER — Encounter: Payer: Self-pay | Admitting: Pharmacist

## 2022-12-31 ENCOUNTER — Ambulatory Visit: Payer: BC Managed Care – PPO | Admitting: Family Medicine

## 2023-01-13 ENCOUNTER — Ambulatory Visit: Payer: BC Managed Care – PPO | Admitting: Family Medicine

## 2023-02-03 ENCOUNTER — Other Ambulatory Visit: Payer: Self-pay | Admitting: Family Medicine

## 2023-02-03 DIAGNOSIS — I10 Essential (primary) hypertension: Secondary | ICD-10-CM

## 2023-02-18 ENCOUNTER — Other Ambulatory Visit: Payer: Self-pay | Admitting: Family Medicine

## 2023-02-18 ENCOUNTER — Encounter: Payer: Self-pay | Admitting: Family Medicine

## 2023-02-18 ENCOUNTER — Ambulatory Visit (INDEPENDENT_AMBULATORY_CARE_PROVIDER_SITE_OTHER): Payer: BC Managed Care – PPO | Admitting: Family Medicine

## 2023-02-18 VITALS — BP 142/86 | HR 90 | Ht 68.0 in | Wt 186.0 lb

## 2023-02-18 DIAGNOSIS — I10 Essential (primary) hypertension: Secondary | ICD-10-CM

## 2023-02-18 DIAGNOSIS — M25532 Pain in left wrist: Secondary | ICD-10-CM

## 2023-02-18 DIAGNOSIS — M654 Radial styloid tenosynovitis [de Quervain]: Secondary | ICD-10-CM

## 2023-02-18 DIAGNOSIS — Z0000 Encounter for general adult medical examination without abnormal findings: Secondary | ICD-10-CM

## 2023-02-18 DIAGNOSIS — Z125 Encounter for screening for malignant neoplasm of prostate: Secondary | ICD-10-CM

## 2023-02-18 DIAGNOSIS — G8929 Other chronic pain: Secondary | ICD-10-CM | POA: Diagnosis not present

## 2023-02-18 DIAGNOSIS — R7309 Other abnormal glucose: Secondary | ICD-10-CM

## 2023-02-18 DIAGNOSIS — E782 Mixed hyperlipidemia: Secondary | ICD-10-CM

## 2023-02-18 NOTE — Progress Notes (Signed)
Subjective:    Patient ID: Ryan Bean, male    DOB: 1965/07/02, 57 y.o.   MRN: [id number]  Ryan Bean is a 57 y.o. male presenting on 02/18/2023 for Hand Pain (Left hand)   HPI  Discussed the use of AI scribe software for clinical note transcription with the patient, who gave verbal consent to proceed.    LEFT DeQuervain Tenosynovitis / Radial pain The patient also reports discomfort in his left hand, specifically at the base of the thumb. The discomfort is described as soreness, which worsens with repetitive tasks, particularly lifting heavy objects at work. The patient reports that the area swells significantly after such activities. The patient is ambidextrous, using both hands equally. He says improved with wrist brace or wrap for support. Needs to know which one to purchase  CHRONIC HTN: Elevated BP still Home readings reviewed Current Meds - HCTZ 25mg  daily, Amlodipine 10mg  daily, Losartan 25mg  daily Previously. Tolerating well, w/o complaints. Lifestyle - Exercise at home, tries to limit salt in diet. No caffeine intake. Denies CP, dyspnea, edema, dizziness / lightheadedness, headache, blurred vision    Health Maintenance:  Cologuard was ordered previously March 2024. He has the kit ready to complete it today after answering questions.     02/18/2023   10:09 AM 06/25/2022    1:44 PM 01/16/2021   10:27 AM  Depression screen PHQ 2/9  Decreased Interest 3 3 3   Down, Depressed, Hopeless 0 0 0  PHQ - 2 Score 3 3 3   Altered sleeping 0 1 3  Tired, decreased energy 0 1 2  Change in appetite 0 1 2  Feeling bad or failure about yourself  0 1 0  Trouble concentrating 0 1 0  Moving slowly or fidgety/restless 0 0 0  Suicidal thoughts 0 0 0  PHQ-9 Score 3 8 10   Difficult doing work/chores Extremely dIfficult Not difficult at all Very difficult    Social History   Tobacco Use   Smoking status: Every Day    Current packs/day: 0.25    Average packs/day:  0.3 packs/day for 30.0 years (7.5 ttl pk-yrs)    Types: Cigarettes   Smokeless tobacco: Current  Substance Use Topics   Alcohol use: Yes    Alcohol/week: 2.0 standard drinks of alcohol    Types: 2 Cans of beer per week    Comment: occasional   Drug use: No    Review of Systems Per HPI unless specifically indicated above     Objective:    BP (!) 142/86 (BP Location: Left Arm, Cuff Size: Normal)   Pulse 90   Ht 5\' 8"  (1.727 m)   Wt 186 lb (84.4 kg)   SpO2 98%   BMI 28.28 kg/m   Wt Readings from Last 3 Encounters:  02/18/23 186 lb (84.4 kg)  06/25/22 186 lb (84.4 kg)  02/11/22 187 lb 12.8 oz (85.2 kg)    Physical Exam Vitals and nursing note reviewed.  Constitutional:      General: He is not in acute distress.    Appearance: Normal appearance. He is well-developed. He is not diaphoretic.     Comments: Well-appearing, comfortable, cooperative  HENT:     Head: Normocephalic and atraumatic.  Eyes:     General:        Right eye: No discharge.        Left eye: No discharge.     Conjunctiva/sclera: Conjunctivae normal.  Cardiovascular:     Rate and Rhythm: Normal rate.  Pulmonary:     Effort: Pulmonary effort is normal.  Skin:    General: Skin is warm and dry.     Findings: No erythema or rash.  Neurological:     Mental Status: He is alert and oriented to person, place, and time.  Psychiatric:        Mood and Affect: Mood normal.        Behavior: Behavior normal.        Thought Content: Thought content normal.     Comments: Well groomed, good eye contact, normal speech and thoughts      Results for orders placed or performed in visit on 06/25/22  COMPLETE METABOLIC PANEL WITH GFR  Result Value Ref Range   Glucose, Bld 94 65 - 99 mg/dL   BUN 11 7 - 25 mg/dL   Creat 0.10 2.72 - 5.36 mg/dL   eGFR 72 > OR = 60 UY/QIH/4.74Q5   BUN/Creatinine Ratio SEE NOTE: 6 - 22 (calc)   Sodium 143 135 - 146 mmol/L   Potassium 3.5 3.5 - 5.3 mmol/L   Chloride 101 98 - 110  mmol/L   CO2 27 20 - 32 mmol/L   Calcium 9.3 8.6 - 10.3 mg/dL   Total Protein 7.3 6.1 - 8.1 g/dL   Albumin 4.4 3.6 - 5.1 g/dL   Globulin 2.9 1.9 - 3.7 g/dL (calc)   AG Ratio 1.5 1.0 - 2.5 (calc)   Total Bilirubin 0.9 0.2 - 1.2 mg/dL   Alkaline phosphatase (APISO) 144 35 - 144 U/L   AST 100 (H) 10 - 35 U/L   ALT 106 (H) 9 - 46 U/L  CBC with Differential/Platelet  Result Value Ref Range   WBC 6.4 3.8 - 10.8 Thousand/uL   RBC 4.56 4.20 - 5.80 Million/uL   Hemoglobin 14.0 13.2 - 17.1 g/dL   HCT 95.6 38.7 - 56.4 %   MCV 89.3 80.0 - 100.0 fL   MCH 30.7 27.0 - 33.0 pg   MCHC 34.4 32.0 - 36.0 g/dL   RDW 33.2 (H) 95.1 - 88.4 %   Platelets 276 140 - 400 Thousand/uL   MPV 10.2 7.5 - 12.5 fL   Neutro Abs 4,211 1,500 - 7,800 cells/uL   Lymphs Abs 1,530 850 - 3,900 cells/uL   Absolute Monocytes 550 200 - 950 cells/uL   Eosinophils Absolute 38 15 - 500 cells/uL   Basophils Absolute 70 0 - 200 cells/uL   Neutrophils Relative % 65.8 %   Total Lymphocyte 23.9 %   Monocytes Relative 8.6 %   Eosinophils Relative 0.6 %   Basophils Relative 1.1 %  Lipid panel  Result Value Ref Range   Cholesterol 352 (H) <200 mg/dL   HDL 64 > OR = 40 mg/dL   Triglycerides 166 (H) <150 mg/dL   LDL Cholesterol (Calc) 252 (H) mg/dL (calc)   Total CHOL/HDL Ratio 5.5 (H) <5.0 (calc)   Non-HDL Cholesterol (Calc) 288 (H) <130 mg/dL (calc)  Hemoglobin A6T  Result Value Ref Range   Hgb A1c MFr Bld 5.7 (H) <5.7 % of total Hgb   Mean Plasma Glucose 117 mg/dL   eAG (mmol/L) 6.5 mmol/L  PSA  Result Value Ref Range   PSA 0.49 < OR = 4.00 ng/mL  TSH  Result Value Ref Range   TSH 0.93 0.40 - 4.50 mIU/L      Assessment & Plan:   Problem List Items Addressed This Visit     Essential hypertension   Relevant Medications   losartan (  COZAAR) 25 MG tablet   Other Visit Diagnoses     De Quervain's tenosynovitis, left    -  Primary   Chronic wrist pain, left           Assessment and Plan    Colon Cancer  Screening Discussed the accuracy of the Cologuard test and clarified that it will detect any abnormalities such as pre cancer or polyps, not just cancer. Emphasis on negative predictive value >99%. He agrees to proceed -Complete and submit the Cologuard test that was previously ordered.  Left Wrist / Thumb Pain Likely de Quervain's tenosynovitis due to repetitive strain. Pain is exacerbated by gripping and lifting heavy objects. -Use a thumb spica splint to provide support and reduce strain. -Apply a topical muscle rub for pain relief. -Consider referral to a hand/wrist orthopedic specialist if symptoms do not improve.  Hypertension Blood pressure remains elevated despite current medication regimen (amlodipine, hydrochlorothiazide, and losartan). -Increase losartan dose to 50mg  daily (take two 25mg  tablets). Notify us when ready for new order of Losartan 50mg  in one dose instead of double 25 -Check blood pressure in follow-up appointment.  Follow-up appointment scheduled for March 2025.       No orders of the defined types were placed in this encounter.     Follow up plan: Return in about 5 months (around 07/19/2023) for 5 month fasting lab only then 1 week later Annual Physical (Need Tues-Thurs-Weds only).  Future labs ordered for 07/15/23  Saralyn Pilar, DO [facility] 02/18/2023, 10:17 AM

## 2023-02-18 NOTE — Patient Instructions (Addendum)
Thank you for coming to the office today.  BP still elevated  I would suggest try doubling Losartan from 25mg  daily up to 50mg  daily.  Keep other BP meds the same  Proceed to do the Cologuard kit at home.  --------------  You most likely have Left hand DeQuervains Tenosynovitis - This is a type of tendonitis involving the tendons from the thumb into the wrist, it is a very common spot for inflammation and pain, usually caused by repetitive activities (lifting, writing, typing, carrying, worse with heavier objective or more repetitive strain) - Once it is flared up it can continue to persist for days to weeks due to inflammation, and mostly importantly needs rest and time to heal  - Recommend trial of Anti-inflammatory with OTC Aleve 220mg  x 2 = 440mg - take 1 to 2  with food and plenty of water TWICE daily every day (breakfast and dinner), for next 2 to 4 weeks, then you may take only as needed  - DO NOT TAKE any ibuprofen, aleve, motrin while you are taking this medicine - It is safe to take Tylenol Ext Str 500mg  tabs - take 1 to 2 (max dose 1000mg ) every 6 hours as needed for breakthrough pain, max 24 hour daily dose is 6 to 8 tablets or 4000mg  - Wrist splint will provide support to the tendons and reduce strain - Purchase a THUMB SPICA SPLINT (to limit thumb and wrist flexion) - REST is extremely important, the goal is to AVOID re-injury, try to modify activities - Also may try ice packs if swelling, or topical icy hot muscle rub can also help ease worsening pain temporarily  If you get significant worsening pain, weakness in your hand (not due to pain), numbness, tingling, burning, more constant without activity, then follow-up sooner as we may need to consider stronger anti-inflammatory with prednisone, or referral for injection.    We can refer to Hand / Wrist Orthopedic -  contact me if need referral.  [facility] (formerly [facility]) Address: [address] [place],  [place], [place] [address] Hours:  9AM-5PM Phone: [phone]   Please schedule a Follow-up Appointment to: Return if symptoms worsen or fail to improve.  If you have any other questions or concerns, please feel free to call the office or send a message through MyChart. You may also schedule an earlier appointment if necessary.  Additionally, you may be receiving a survey about your experience at our office within a few days to 1 week by e-mail or mail. We value your feedback.  Saralyn Pilar, DO [facility], [place]  [email or web address]

## 2023-05-10 ENCOUNTER — Other Ambulatory Visit: Payer: Self-pay | Admitting: Family Medicine

## 2023-05-10 DIAGNOSIS — I10 Essential (primary) hypertension: Secondary | ICD-10-CM
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
## 2023-07-15 ENCOUNTER — Other Ambulatory Visit: Payer: Self-pay

## 2023-07-15 DIAGNOSIS — E782 Mixed hyperlipidemia: Secondary | ICD-10-CM

## 2023-07-15 DIAGNOSIS — I10 Essential (primary) hypertension: Secondary | ICD-10-CM

## 2023-07-15 DIAGNOSIS — Z0000 Encounter for general adult medical examination without abnormal findings: Secondary | ICD-10-CM

## 2023-07-15 DIAGNOSIS — Z125 Encounter for screening for malignant neoplasm of prostate: Secondary | ICD-10-CM

## 2023-07-15 DIAGNOSIS — R7309 Other abnormal glucose: Secondary | ICD-10-CM

## 2023-07-21 ENCOUNTER — Ambulatory Visit: Payer: Self-pay

## 2023-07-21 NOTE — Telephone Encounter (Signed)
Tried calling patient, no answer or vm.

## 2023-07-21 NOTE — Telephone Encounter (Signed)
  Chief Complaint: right elbow pain Symptoms: pain  Disposition: [] ED /[] Urgent Care (no appt availability in office) / [x] Appointment(In office/virtual)/ []  [place] [facility]/ [] Home Care/ [] Refused Recommended Disposition /[] [place] [facility]/ []  Follow-up with PCP Additional Notes: Pt called with right elbow pain and wanting lidocaine patch order. Pt was irritated throughout triage. Pt already had an appt scheduled for tomorrow but wanted to cancel due to "I know I can't get there. I can't drive.If I can't be seen today, I'm going to ED." Pt wouldn't wait for RN to check schedule for earlier appt. Pt hung up without care advice. RN did not cancel appt. Please follow up with pt.            Copied from CRM [id number]. Topic: Clinical - Red Word Triage >> Jul 21, 2023 12:27 PM Antwanette L wrote: Red Word that prompted transfer to Nurse Triage: right arm is swollen and pt is requesting a lidocaine patch Reason for Disposition  [1] SEVERE pain AND [2] not improved 2 hours after pain medicine/ice packs  Answer Assessment - Initial Assessment Questions 1. MECHANISM: "How did the injury happen?" Not sure  2. ONSET: "When did the injury happen?" (Minutes or hours ago)      Not sure  3. LOCATION: "Where is the injury located?" "Which arm?"     Right elbow  4. APPEARANCE of INJURY: "What does the injury look like?"      Swollen  5. SEVERITY: "Can you use the arm normally?"      no 6. SWELLING or BRUISING: "is there any swelling or bruising?" If Yes, ask: "How large is it? (e.g., inches, centimeters)      Swelling  7. PAIN: "Is there pain?" If Yes, ask: "How bad is the pain?"    (Scale 1-10; or mild, moderate, severe)   - NONE (0): No pain.   - MILD (1-3): Doesn't interfere with normal activities.   - MODERATE (4-7): Interferes with normal activities (e.g., work or school) or awakens from sleep.   - SEVERE (8-10): Excruciating pain, unable to do any normal activities, unable to  hold a cup of water.     Can't answer  Protocols used: Arm Injury-A-AH

## 2023-07-22 ENCOUNTER — Encounter: Payer: BC Managed Care – PPO | Admitting: Family Medicine

## 2023-07-31 ENCOUNTER — Inpatient Hospital Stay: Admitting: Family Medicine

## 2023-08-06 ENCOUNTER — Ambulatory Visit: Admitting: Family Medicine

## 2023-08-12 ENCOUNTER — Ambulatory Visit (INDEPENDENT_AMBULATORY_CARE_PROVIDER_SITE_OTHER): Admitting: Family Medicine

## 2023-08-12 ENCOUNTER — Encounter: Payer: Self-pay | Admitting: Family Medicine

## 2023-08-12 VITALS — BP 140/86 | HR 104 | Resp 19 | Ht 68.0 in | Wt 194.8 lb

## 2023-08-12 DIAGNOSIS — I10 Essential (primary) hypertension: Secondary | ICD-10-CM

## 2023-08-12 DIAGNOSIS — Z125 Encounter for screening for malignant neoplasm of prostate: Secondary | ICD-10-CM

## 2023-08-12 DIAGNOSIS — R7309 Other abnormal glucose: Secondary | ICD-10-CM | POA: Diagnosis not present

## 2023-08-12 DIAGNOSIS — M7021 Olecranon bursitis, right elbow: Secondary | ICD-10-CM

## 2023-08-12 DIAGNOSIS — M25521 Pain in right elbow: Secondary | ICD-10-CM

## 2023-08-12 DIAGNOSIS — Z0000 Encounter for general adult medical examination without abnormal findings: Secondary | ICD-10-CM

## 2023-08-12 DIAGNOSIS — T22131A Burn of first degree of right upper arm, initial encounter: Secondary | ICD-10-CM

## 2023-08-12 DIAGNOSIS — E782 Mixed hyperlipidemia: Secondary | ICD-10-CM

## 2023-08-12 MED ORDER — ROSUVASTATIN CALCIUM 40 MG PO TABS: 40.0000 mg | ORAL_TABLET | Freq: Every day | ORAL | 3 refills | Status: AC

## 2023-08-12 MED ORDER — HYDROCHLOROTHIAZIDE 25 MG PO TABS: 25.0000 mg | ORAL_TABLET | Freq: Every day | ORAL | 1 refills | Status: DC

## 2023-08-12 MED ORDER — LIDOCAINE 5 % EX PTCH: 1.0000 | MEDICATED_PATCH | CUTANEOUS | 5 refills | Status: AC

## 2023-08-12 MED ORDER — LOSARTAN POTASSIUM 50 MG PO TABS: 50.0000 mg | ORAL_TABLET | Freq: Every day | ORAL | 1 refills | Status: DC

## 2023-08-12 MED ORDER — AMLODIPINE BESYLATE 10 MG PO TABS: 10.0000 mg | ORAL_TABLET | Freq: Every day | ORAL | 1 refills | Status: DC

## 2023-08-12 NOTE — Patient Instructions (Addendum)
 Thank you for coming to the office today.  Lab orders for blood draw next week Wednesday 4/30 at 10am  Refilled Lidoderm  patches.  Dose increase Losartan  25mg  up to 50mg  daily, new rx sent for 50mg  daily  Continue other BP meds and cholesterol med.  CALL COLOGUARD to check status of your current kit if it is not expired okay to complete that one.  Colon Cancer Screening: - For all adults age 59+ routine colon cancer screening is highly recommended.     - Recent guidelines from American [employer] Society recommend starting age of 90 - Early detection of colon cancer is important, because often there are no warning signs or symptoms, also if found early usually it can be cured. Late stage is hard to treat.   - If Cologuard is NEGATIVE, then it is good for 3 years before next due - If Cologuard is POSITIVE, then it is strongly advised to get a Colonoscopy, which allows the GI doctor to locate the source of the cancer or polyp (even very early stage) and treat it by removing it. ------------------------- Follow instructions to collect sample, you may call the company for any help or questions, 24/7 telephone support at [phone].   Please schedule a Follow-up Appointment to: Return in about 6 months (around 02/11/2024) for 6 month HTN update.  If you have any other questions or concerns, please feel free to call the office or send a message through MyChart. You may also schedule an earlier appointment if necessary.  Additionally, you may be receiving a survey about your experience at our office within a few days to 1 week by e-mail or mail. We value your feedback.  Domingo Friend, DO [facility], [place]
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
## 2023-08-20 ENCOUNTER — Other Ambulatory Visit

## 2023-08-20 DIAGNOSIS — E782 Mixed hyperlipidemia: Secondary | ICD-10-CM

## 2023-08-20 DIAGNOSIS — I10 Essential (primary) hypertension: Secondary | ICD-10-CM | POA: Diagnosis not present

## 2023-08-20 DIAGNOSIS — Z0000 Encounter for general adult medical examination without abnormal findings: Secondary | ICD-10-CM

## 2023-08-20 DIAGNOSIS — R7309 Other abnormal glucose: Secondary | ICD-10-CM

## 2023-08-20 DIAGNOSIS — Z125 Encounter for screening for malignant neoplasm of prostate: Secondary | ICD-10-CM

## 2023-08-21 ENCOUNTER — Telehealth: Payer: Self-pay | Admitting: Family Medicine

## 2023-08-21 LAB — CBC WITH DIFFERENTIAL/PLATELET
Absolute Lymphocytes: 2337 {cells}/uL (ref 850–3900)
Absolute Monocytes: 359 {cells}/uL (ref 200–950)
Basophils Absolute: 60 {cells}/uL (ref 0–200)
Basophils Relative: 1.3 %
Eosinophils Absolute: 32 {cells}/uL (ref 15–500)
Eosinophils Relative: 0.7 %
HCT: 45.1 % (ref 38.5–50.0)
Hemoglobin: 15.6 g/dL (ref 13.2–17.1)
MCH: 32.2 pg (ref 27.0–33.0)
MCHC: 34.6 g/dL (ref 32.0–36.0)
MCV: 93 fL (ref 80.0–100.0)
MPV: 10.2 fL (ref 7.5–12.5)
Monocytes Relative: 7.8 %
Neutro Abs: 1812 {cells}/uL (ref 1500–7800)
Neutrophils Relative %: 39.4 %
Platelets: 212 10*3/uL (ref 140–400)
RBC: 4.85 10*6/uL (ref 4.20–5.80)
RDW: 15 % (ref 11.0–15.0)
Total Lymphocyte: 50.8 %
WBC: 4.6 10*3/uL (ref 3.8–10.8)

## 2023-08-21 LAB — COMPREHENSIVE METABOLIC PANEL WITH GFR
AG Ratio: 1.8 (calc) (ref 1.0–2.5)
ALT: 140 U/L — ABNORMAL HIGH (ref 9–46)
AST: 257 U/L — ABNORMAL HIGH (ref 10–35)
Albumin: 5 g/dL (ref 3.6–5.1)
Alkaline phosphatase (APISO): 141 U/L (ref 35–144)
BUN: 8 mg/dL (ref 7–25)
CO2: 22 mmol/L (ref 20–32)
Calcium: 9.3 mg/dL (ref 8.6–10.3)
Chloride: 102 mmol/L (ref 98–110)
Creat: 1.03 mg/dL (ref 0.70–1.30)
Globulin: 2.8 g/dL (ref 1.9–3.7)
Glucose, Bld: 110 mg/dL — ABNORMAL HIGH (ref 65–99)
Potassium: 3.6 mmol/L (ref 3.5–5.3)
Sodium: 137 mmol/L (ref 135–146)
Total Bilirubin: 0.9 mg/dL (ref 0.2–1.2)
Total Protein: 7.8 g/dL (ref 6.1–8.1)
eGFR: 84 mL/min/{1.73_m2}

## 2023-08-21 LAB — LIPID PANEL
Cholesterol: 206 mg/dL — ABNORMAL HIGH (ref <200)
HDL: 55 mg/dL (ref >=40)
Non-HDL Cholesterol (Calc): 151 mg/dL — ABNORMAL HIGH (ref <130)
Total CHOL/HDL Ratio: 3.7 (calc) (ref <5.0)
Triglycerides: 988 mg/dL — ABNORMAL HIGH (ref <150)

## 2023-08-21 LAB — HEMOGLOBIN A1C
Hgb A1c MFr Bld: 5.5 % (ref <5.7)
Mean Plasma Glucose: 111 mg/dL
eAG (mmol/L): 6.2 mmol/L

## 2023-08-21 LAB — PSA: PSA: 1.07 ng/mL (ref <=4.00)

## 2023-08-21 NOTE — Telephone Encounter (Signed)
 LM for patient to call back for further information

## 2023-08-21 NOTE — Telephone Encounter (Signed)
 I received a fax from [facility] for Attending Physician Statement and Return to Work documentation for Disability Claim.  Claim # [id number]  I am not sure what this is in reference to. He was on a short term disability back in 2022. But I do not have any documentation of any recent disability claims.  I did not take him out of work recently. Also the form says "To Dr Salomon Cree" I am not sure if this was supposed to go to a different doctor.  Can you contact patient to find out more information? Otherwise we will not be able to fill it out. The response is requested by 08/22/23. I do not know that I can complete this form without seeing him. So unfortunately it is unlikely to be done at this time.  If he needs this form, it requires a visit and we have to know why he is on disability or leave.  Domingo Friend, DO [facility] 08/21/2023, 4:23 PM

## 2023-08-22 ENCOUNTER — Encounter: Payer: Self-pay | Admitting: Family Medicine

## 2023-08-22 ENCOUNTER — Telehealth: Payer: Self-pay

## 2023-08-22 NOTE — Telephone Encounter (Signed)
 Refer to other message, patient was notified to contact the doctor that took him out of work

## 2023-08-22 NOTE — Telephone Encounter (Signed)
 Copied from CRM [id number]. Topic: General - Other >> Aug 21, 2023  4:49 PM Ja-Kwan M wrote: Reason for CRM: Patient called for an update on the form that was sent in from [facility]. Called [facility] but there was no answer. Informed patient that the notes state that Dr. Linnell Richardson did not take him out of work recently and the form says to Dr. Salomon Cree. Suggested that patient contact the doctor that took him out of work.

## 2023-08-27 ENCOUNTER — Encounter: Payer: Self-pay | Admitting: Family Medicine

## 2023-08-27 ENCOUNTER — Ambulatory Visit: Admitting: Family Medicine

## 2023-08-27 VITALS — BP 148/90 | HR 110 | Ht 68.0 in | Wt 185.4 lb

## 2023-08-27 DIAGNOSIS — M7021 Olecranon bursitis, right elbow: Secondary | ICD-10-CM

## 2023-08-27 DIAGNOSIS — M25521 Pain in right elbow: Secondary | ICD-10-CM

## 2023-08-27 DIAGNOSIS — R7989 Other specified abnormal findings of blood chemistry: Secondary | ICD-10-CM

## 2023-08-27 DIAGNOSIS — I10 Essential (primary) hypertension: Secondary | ICD-10-CM

## 2023-08-27 NOTE — Patient Instructions (Addendum)
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
## 2023-12-01 ENCOUNTER — Other Ambulatory Visit: Payer: Self-pay | Admitting: Family Medicine

## 2023-12-01 DIAGNOSIS — M62830 Muscle spasm of back: Secondary | ICD-10-CM

## 2023-12-01 DIAGNOSIS — I10 Essential (primary) hypertension: Secondary | ICD-10-CM

## 2023-12-01 DIAGNOSIS — E782 Mixed hyperlipidemia: Secondary | ICD-10-CM

## 2023-12-01 DIAGNOSIS — G8929 Other chronic pain: Secondary | ICD-10-CM

## 2023-12-01 NOTE — Telephone Encounter (Signed)
 Copied from CRM #[id number]. Topic: Clinical - Medication Refill >> Dec 01, 2023 11:40 AM Delon T wrote: Medication: amLODipine  (NORVASC ) 10 MG tablet hydrochlorothiazide  (HYDRODIURIL ) 25 MG tablet losartan  (COZAAR ) 50 MG tablet  methocarbamol  (ROBAXIN ) 500 MG tablet rosuvastatin  (CRESTOR ) 40 MG tablet  Has the patient contacted their pharmacy? Yes (Agent: If no, request that the patient contact the pharmacy for the refill. If patient does not wish to contact the pharmacy document the reason why and proceed with request.) (Agent: If yes, when and what did the pharmacy advise?)  This is the patient's preferred pharmacy:  [id number] [place], [place] - [address] [place] [place] [address] Phone: [phone] Fax: [phone]   Is this the correct pharmacy for this prescription? Yes If no, delete pharmacy and type the correct one.   Has the prescription been filled recently? Yes  Is the patient out of the medication? Yes  Has the patient been seen for an appointment in the last year OR does the patient have an upcoming appointment? Yes  Can we respond through MyChart? Yes  Agent: Please be advised that Rx refills may take up to 3 business days. We ask that you follow-up with your pharmacy.

## 2023-12-04 MED ORDER — METHOCARBAMOL 500 MG PO TABS: 500.0000 mg | ORAL_TABLET | Freq: Three times a day (TID) | ORAL | 3 refills | Status: AC | PRN

## 2023-12-04 NOTE — Telephone Encounter (Signed)
 Requested medications are due for refill today.  yes  Requested medications are on the active medications list.  yes  Last refill. 3/5/20234 #[id number]  Future visit scheduled.   yes  Notes to clinic.  Refill not delegated.    Requested Prescriptions  Pending Prescriptions Disp Refills   methocarbamol  (ROBAXIN ) 500 MG tablet 90 tablet 3    Sig: Take 1 tablet (500 mg total) by mouth every 8 (eight) hours as needed for muscle spasms.     Not Delegated - Analgesics:  Muscle Relaxants Failed - 12/04/2023 12:06 PM      Failed - This refill cannot be delegated      Passed - Valid encounter within last 6 months    Recent Outpatient Visits           3 months ago Right elbow pain   [place] [facility] [place] Marsa PARAS, DO   3 months ago Annual physical exam   [place] [facility] [facility]a PARAS, DO       Future Appointments             In 2 months [place], Marsa PARAS, DO [place] [facility]            Refused Prescriptions Disp Refills   amLODipine  (NORVASC ) 10 MG tablet 90 tablet 1    Sig: Take 1 tablet (10 mg total) by mouth daily.     Cardiovascular: Calcium  Channel Blockers 2 Failed - 12/04/2023 12:06 PM      Failed - Last BP in normal range    BP Readings from Last 1 Encounters:  08/27/23 (!) 148/90         Passed - Last Heart Rate in normal range    Pulse Readings from Last 1 Encounters:  08/27/23 (!) 110         Passed - Valid encounter within last 6 months    Recent Outpatient Visits           3 months ago Right elbow pain   [place] [facility] [place], Marsa PARAS, DO   3 months ago Annual physical exam   [place] [facility] Marsa PARAS, DO       Future Appointments             In 2 months Edman, Marsa PARAS, DO [place] [facility]             hydrochlorothiazide   (HYDRODIURIL ) 25 MG tablet 90 tablet 1    Sig: Take 1 tablet (25 mg total) by mouth daily.     Cardiovascular: Diuretics - Thiazide Failed - 12/04/2023 12:06 PM      Failed - Last BP in normal range    BP Readings from Last 1 Encounters:  08/27/23 (!) 148/90         Passed - Cr in normal range and within 180 days    Creat  Date Value Ref Range Status  08/20/2023 1.03 0.70 - 1.30 mg/dL Final         Passed - K in normal range and within 180 days    Potassium  Date Value Ref Range Status  08/20/2023 3.6 3.5 - 5.3 mmol/L Final         Passed - Na in normal range and within 180 days    Sodium  Date Value Ref Range Status  08/20/2023 137 135 - 146 mmol/L  Final  07/13/2015 137 134 - 144 mmol/L Final         Passed - Valid encounter within last 6 months    Recent Outpatient Visits           3 months ago Right elbow pain   [place] [facility] [place], Marsa PARAS, DO   3 months ago Annual physical exam   [place] [facility] Marsa PARAS, DO       Future Appointments             In 2 months Edman, Marsa PARAS, DO [place] [facility]             losartan  (COZAAR ) 50 MG tablet 90 tablet 1    Sig: Take 1 tablet (50 mg total) by mouth daily.     Cardiovascular:  Angiotensin Receptor Blockers Failed - 12/04/2023 12:06 PM      Failed - Last BP in normal range    BP Readings from Last 1 Encounters:  08/27/23 (!) 148/90         Passed - Cr in normal range and within 180 days    Creat  Date Value Ref Range Status  08/20/2023 1.03 0.70 - 1.30 mg/dL Final         Passed - K in normal range and within 180 days    Potassium  Date Value Ref Range Status  08/20/2023 3.6 3.5 - 5.3 mmol/L Final         Passed - Patient is not pregnant      Passed - Valid encounter within last 6 months    Recent Outpatient Visits           3 months ago Right elbow pain   [facility] Marsa PARAS, DO   3 months ago Annual physical exam   [place] Marsa PARAS, DO       Future Appointments             In 2 months Edman, Marsa PARAS, DO [facility], [facility]             rosuvastatin  (CRESTOR ) 40 MG tablet 90 tablet 3    Sig: Take 1 tablet (40 mg total) by mouth at bedtime.     Cardiovascular:  Antilipid - Statins 2 Failed - 12/04/2023 12:06 PM      Failed - Lipid Panel in normal range within the last 12 months    Cholesterol, Total  Date Value Ref Range Status  08/03/2015 229 (H) 100 - 199 mg/dL Final   Cholesterol  Date Value Ref Range Status  08/20/2023 206 (H) <200 mg/dL Final   LDL Cholesterol (Calc)  Date Value Ref Range Status  08/20/2023  mg/dL (calc) Final    Comment:    . LDL cholesterol not calculated. Triglyceride levels greater than 400 mg/dL invalidate calculated LDL results. . Reference range: <100 . Desirable range <100 mg/dL for primary prevention;   <70 mg/dL for patients with CHD or diabetic patients  with > or = 2 CHD risk factors. SABRA LDL-C is now calculated using the Martin-Hopkins  calculation, which is a validated novel method providing  better accuracy than the Friedewald equation in the  estimation of LDL-C.  Gladis APPLETHWAITE et al. SANDREA. 7986;689(80): 2061-2068  (http://education.Quest[employer].com/faq/FAQ164)    HDL  Date Value Ref Range Status  08/20/2023 55 >  OR = 40 mg/dL Final  95/86/7982 32 (L) >39 mg/dL Final   Triglycerides  Date Value Ref Range Status  08/20/2023 988 (H) <150 mg/dL Final    Comment:    . If a non-fasting specimen was collected, consider repeat triglyceride testing on a fasting specimen if clinically indicated.  Veatrice et al. J. of Clin. Lipidol. 2015;9:129-169. . . There is increased risk of pancreatitis when the  triglyceride concentration is very high  (> or = 500 mg/dL, especially if  > or = 1000 mg/dL).  Veatrice et al. J. of Clin. Lipidol. 2015;9:129-169. SABRA          Passed - Cr in normal range and within 360 days    Creat  Date Value Ref Range Status  08/20/2023 1.03 0.70 - 1.30 mg/dL Final         Passed - Patient is not pregnant      Passed - Valid encounter within last 12 months    Recent Outpatient Visits           3 months ago Right elbow pain   [place] [facility] [place], Marsa PARAS, DO   3 months ago Annual physical exam   [place] [facility] Marsa PARAS, DO       Future Appointments             In 2 months Edman, Marsa PARAS, DO  [facility], [facility]

## 2023-12-04 NOTE — Telephone Encounter (Signed)
 Requested Prescriptions  Pending Prescriptions Disp Refills   methocarbamol  (ROBAXIN ) 500 MG tablet 90 tablet 3    Sig: Take 1 tablet (500 mg total) by mouth every 8 (eight) hours as needed for muscle spasms.     Not Delegated - Analgesics:  Muscle Relaxants Failed - 12/04/2023 12:06 PM      Failed - This refill cannot be delegated      Passed - Valid encounter within last 6 months    Recent Outpatient Visits           3 months ago Right elbow pain   [place] [facility] [place] Marsa PARAS, DO   3 months ago Annual physical exam   [place] [facility] Ed[place] Marsa PARAS, DO       Future Appointments             In 2 months Edman, Marsa PARAS, DO [place] [facility]            Refused Prescriptions Disp Refills   amLODipine  (NORVASC ) 10 MG tablet 90 tablet 1    Sig: Take 1 tablet (10 mg total) by mouth daily.     Cardiovascular: Calcium  Channel Blockers 2 Failed - 12/04/2023 12:06 PM      Failed - Last BP in normal range    BP Readings from Last 1 Encounters:  08/27/23 (!) 148/90         Passed - Last Heart Rate in normal range    Pulse Readings from Last 1 Encounters:  08/27/23 (!) 110         Passed - Valid encounter within last 6 months    Recent Outpatient Visits           3 months ago Right elbow pain   [place] [place], Marsa PARAS, DO   3 months ago Annual physical exam   [place] [facility] Edman Marsa PARAS, DO       Future Appointments             In 2 months Edman, Marsa PARAS, DO [place] [facility]             hydrochlorothiazide  (HYDRODIURIL ) 25 MG tablet 90 tablet 1    Sig: Take 1 tablet (25 mg total) by mouth daily.     Cardiovascular: Diuretics - Thiazide Failed - 12/04/2023 12:06 PM      Failed - Last BP in normal range    BP Readings from Last 1 Encounters:   08/27/23 (!) 148/90         Passed - Cr in normal range and within 180 days    Creat  Date Value Ref Range Status  08/20/2023 1.03 0.70 - 1.30 mg/dL Final         Passed - K in normal range and within 180 days    Potassium  Date Value Ref Range Status  08/20/2023 3.6 3.5 - 5.3 mmol/L Final         Passed - Na in normal range and within 180 days    Sodium  Date Value Ref Range Status  08/20/2023 137 135 - 146 mmol/L Final  07/13/2015 137 134 - 144 mmol/L Final         Passed - Valid encounter within last 6 months    Recent Outpatient Visits           3 months ago  Right elbow pain   [place] [facility] Ed[facility] Marsa PARAS, DO   3 months ago Annual physical exam   [place] [facility] Edman Marsa PARAS, DO       Future Appointments             In 2 months Edman, Marsa PARAS, DO [place] [facility]             losartan  (COZAAR ) 50 MG tablet 90 tablet 1    Sig: Take 1 tablet (50 mg total) by mouth daily.     Cardiovascular:  Angiotensin Receptor Blockers Failed - 12/04/2023 12:06 PM      Failed - Last BP in normal range    BP Readings from Last 1 Encounters:  08/27/23 (!) 148/90         Passed - Cr in normal range and within 180 days    Creat  Date Value Ref Range Status  08/20/2023 1.03 0.70 - 1.30 mg/dL Final         Passed - K in normal range and within 180 days    Potassium  Date Value Ref Range Status  08/20/2023 3.6 3.5 - 5.3 mmol/L Final         Passed - Patient is not pregnant      Passed - Valid encounter within last 6 months    Recent Outpatient Visits           3 months ago Right elbow pain   [place] [facility] Ed[facility] Marsa PARAS, DO   3 months ago Annual physical exam   [place] [facility] Ed[facility] Marsa PARAS, DO       Future Appointments             In 2 months Edman, Marsa PARAS, DO [facility]             rosuvastatin  (CRESTOR ) 40 MG tablet 90 tablet 3    Sig: Take 1 tablet (40 mg total) by mouth at bedtime.     Cardiovascular:  Antilipid - Statins 2 Failed - 12/04/2023 12:06 PM      Failed - Lipid Panel in normal range within the last 12 months    Cholesterol, Total  Date Value Ref Range Status  08/03/2015 229 (H) 100 - 199 mg/dL Final   Cholesterol  Date Value Ref Range Status  08/20/2023 206 (H) <200 mg/dL Final   LDL Cholesterol (Calc)  Date Value Ref Range Status  08/20/2023  mg/dL (calc) Final    Comment:    . LDL cholesterol not calculated. Triglyceride levels greater than 400 mg/dL invalidate calculated LDL results. . Reference range: <100 . Desirable range <100 mg/dL for primary prevention;   <70 mg/dL for patients with CHD or diabetic patients  with > or = 2 CHD risk factors. SABRA LDL-C is now calculated using the Martin-Hopkins  calculation, which is a validated novel method providing  better accuracy than the Friedewald equation in the  estimation of LDL-C.  Gladis APPLETHWAITE et al. SANDREA. 7986;689(80): 2061-2068  (http://education.QuestDiagnostics.com/faq/FAQ164)    HDL  Date Value Ref Range Status  08/20/2023 55 > OR = 40 mg/dL Final  95/86/7982 32 (L) >39 mg/dL Final   Triglycerides  Date Value Ref Range Status  08/20/2023 988 (H) <150 mg/dL Final    Comment:    . If a non-fasting specimen was collected, consider repeat  triglyceride testing on a fasting specimen if clinically indicated.  Veatrice et al. J. of Clin. Lipidol. 2015;9:129-169. . . There is increased risk of pancreatitis when the  triglyceride concentration is very high  (> or = 500 mg/dL, especially if > or = 8999 mg/dL).  Veatrice et al. J. of Clin. Lipidol. 2015;9:129-169. SABRA          Passed - Cr in normal range and within 360 days    Creat  Date Value Ref Range Status  08/20/2023 1.03 0.70 - 1.30 mg/dL Final         Passed -  Patient is not pregnant      Passed - Valid encounter within last 12 months    Recent Outpatient Visits           3 months ago Right elbow pain   [facility] [place], Marsa PARAS, DO   3 months ago Annual physical exam   [facility] [facility] Edman Marsa PARAS, DO       Future Appointments             In 2 months Edman, Marsa PARAS, DO [place] [facility] [place]

## 2023-12-18 ENCOUNTER — Other Ambulatory Visit: Payer: Self-pay | Admitting: Family Medicine

## 2023-12-18 NOTE — Telephone Encounter (Unsigned)
 Copied from CRM [id number]. Topic: Clinical - Medication Refill >> Dec 18, 2023 10:02 AM Amy B wrote: Medication:  amLODipine  (NORVASC ) 10 MG tablet; losartan  (COZAAR ) 50 MG tablet  Has the patient contacted their pharmacy? Yes (Agent: If no, request that the patient contact the pharmacy for the refill. If patient does not wish to contact the pharmacy document the reason why and proceed with request.) (Agent: If yes, when and what did the pharmacy advise?)  This is the patient's preferred pharmacy:  [id number] [place], [place] - [address] [place] [address] Phone: [phone] Fax: [phone]  Is this the correct pharmacy for this prescription? Yes If no, delete pharmacy and type the correct one.   Has the prescription been filled recently? No  Is the patient out of the medication? Yes  Has the patient been seen for an appointment in the last year OR does the patient have an upcoming appointment? Yes  Can we respond through MyChart? Yes  Agent: Please be advised that Rx refills may take up to 3 business days. We ask that you follow-up with your pharmacy.

## 2024-02-04 ENCOUNTER — Ambulatory Visit: Admitting: Family Medicine

## 2024-02-10 ENCOUNTER — Ambulatory Visit: Admitting: Family Medicine

## 2024-03-04 ENCOUNTER — Ambulatory Visit: Payer: Self-pay

## 2024-03-04 NOTE — Telephone Encounter (Signed)
 FYI Only or Action Required?: FYI only for provider: appointment scheduled on 03/10/2024 at 10:20 AM.  Patient was last seen in primary care on 08/27/2023 by Edman Marsa PARAS, DO.  Called Nurse Triage reporting Hand Swelling.  Symptoms began about a month ago.  Interventions attempted: OTC medications: Aleve and Rest, hydration, or home remedies.  Symptoms are: unchanged.  Triage Disposition: See PCP Within 2 Weeks  Patient/caregiver understands and will follow disposition?: Yes  Copied from CRM [id number]. Topic: Clinical - Red Word Triage >> Mar 04, 2024  9:31 AM Ivette P wrote: [facility] that prompted transfer to Nurse Triage: swollen on the left hand Reason for Disposition  [1] Swelling (puffiness of hand or hands) AND [2] is a chronic symptom (recurrent or ongoing AND present > 4 weeks)  Answer Assessment - Initial Assessment Questions Patient reports left hand swelling that started over a month ago. Patient endorses that he was suppose to see his provider last month but had to cancel due to a death in the family. Patient is unsure what could be causing the swelling. Scheduled to see provider on 03/10/2024 at 10:20 AM.   1. ONSET: When did the swelling start? (e.g., minutes, hours, days)     Started last month 2. LOCATION: What part of the hand is swollen?  Are both hands swollen or just one hand?     Left hand 3. TYPE : What does it look like? (e.g., ball, lump; localized; hand swelling)     Hand swelling 4. SWELLING SEVERITY: If more than a lump or localized, ask: How bad is the hand swelling? (e.g., mild, moderate, severe; describe)     Chronic swelling that patient states has worsened.  5. REDNESS: Is there redness or signs of infection?     no 6. PAIN: Is the swelling painful to touch? If Yes, ask: How painful is it?   (Scale 1-10; mild, moderate or severe)     10 7. FEVER: Do you have a fever? If Yes, ask: What is it, how was it measured, and when  did it start?      no 8. CAUSE: What do you think is causing the hand swelling? (e.g., heat, insect bite, pregnancy, recent injury)     unsure 9. MEDICAL HISTORY: Do you have a history of heart failure, kidney disease, liver failure, or cancer?     no 10. RECURRENT SYMPTOM: Have you had hand swelling before? If Yes, ask: When was the last time? What happened that time?       yes 11. OTHER SYMPTOMS: Do you have any other symptoms? (e.g., blurred vision, difficulty breathing, headache)       no  Protocols used: Hand Swelling-A-AH

## 2024-03-10 ENCOUNTER — Other Ambulatory Visit: Payer: Self-pay | Admitting: Family Medicine

## 2024-03-10 ENCOUNTER — Ambulatory Visit
Admission: RE | Admit: 2024-03-10 | Discharge: 2024-03-10 | Disposition: A | Discharge status: Home / Self Care | Source: Ambulatory Visit | Attending: Family Medicine | Admitting: Family Medicine

## 2024-03-10 ENCOUNTER — Ambulatory Visit (INDEPENDENT_AMBULATORY_CARE_PROVIDER_SITE_OTHER): Admitting: Family Medicine

## 2024-03-10 ENCOUNTER — Ambulatory Visit: Payer: Self-pay | Admitting: Family Medicine

## 2024-03-10 ENCOUNTER — Encounter: Payer: Self-pay | Admitting: Family Medicine

## 2024-03-10 VITALS — BP 136/92 | HR 101 | Ht 68.0 in | Wt 184.4 lb

## 2024-03-10 DIAGNOSIS — M21242 Flexion deformity, left finger joints: Secondary | ICD-10-CM | POA: Diagnosis not present

## 2024-03-10 DIAGNOSIS — Z125 Encounter for screening for malignant neoplasm of prostate: Secondary | ICD-10-CM

## 2024-03-10 DIAGNOSIS — E782 Mixed hyperlipidemia: Secondary | ICD-10-CM

## 2024-03-10 DIAGNOSIS — M720 Palmar fascial fibromatosis [Dupuytren]: Secondary | ICD-10-CM | POA: Diagnosis not present

## 2024-03-10 DIAGNOSIS — M79642 Pain in left hand: Secondary | ICD-10-CM | POA: Diagnosis not present

## 2024-03-10 DIAGNOSIS — M19042 Primary osteoarthritis, left hand: Secondary | ICD-10-CM | POA: Diagnosis not present

## 2024-03-10 DIAGNOSIS — I10 Essential (primary) hypertension: Secondary | ICD-10-CM

## 2024-03-10 DIAGNOSIS — Z0000 Encounter for general adult medical examination without abnormal findings: Secondary | ICD-10-CM

## 2024-03-10 DIAGNOSIS — R7309 Other abnormal glucose: Secondary | ICD-10-CM

## 2024-03-10 DIAGNOSIS — M85842 Other specified disorders of bone density and structure, left hand: Secondary | ICD-10-CM | POA: Diagnosis not present

## 2024-03-10 DIAGNOSIS — G8929 Other chronic pain: Secondary | ICD-10-CM

## 2024-03-10 MED ORDER — HYDROCHLOROTHIAZIDE 25 MG PO TABS: 25.0000 mg | ORAL_TABLET | Freq: Every day | ORAL | 1 refills | Status: AC

## 2024-03-10 MED ORDER — LOSARTAN POTASSIUM 50 MG PO TABS: 50.0000 mg | ORAL_TABLET | Freq: Every day | ORAL | 1 refills | Status: AC

## 2024-03-10 MED ORDER — AMLODIPINE BESYLATE 10 MG PO TABS: 10.0000 mg | ORAL_TABLET | Freq: Every day | ORAL | 1 refills | Status: AC

## 2024-03-10 MED ORDER — NAPROXEN 500 MG PO TABS: 500.0000 mg | ORAL_TABLET | Freq: Two times a day (BID) | ORAL | 0 refills | Status: AC

## 2024-03-10 NOTE — Patient Instructions (Addendum)
 Thank you for coming to the office today.  Likely a Dupuytren contracture with the left hand tendons and limited range of motion Probably some arthritis as well. Concern for nerve damage affecting the motion.  X-ray today, call back later with results.  Elsie HERO. Camella MOULD, MD Hand surgeon in [place], [facility]  Address: [address], [place], [place] [address] Phone: [phone]   START anti inflammatory topical - OTC Voltaren (generic Diclofenac) topical 2-4 times a day as needed for pain swelling of affected joint for 1-2 weeks or longer.  Refilled meds  DUE for FASTING BLOOD WORK (no food or drink after midnight before the lab appointment, only water or coffee without cream/sugar on the morning of)  SCHEDULE Lab Only visit in the morning at the clinic for lab draw in 5 MONTHS   - Make sure Lab Only appointment is at about 1 week before your next appointment, so that results will be available  For Lab Results, once available within 2-3 days of blood draw, you can can log in to MyChart online to view your results and a brief explanation. Also, we can discuss results at next follow-up visit.    Please schedule a Follow-up Appointment to: Return for 5 month fasting lab > 1 week later Annual Physical.  If you have any other questions or concerns, please feel free to call the office or send a message through MyChart. You may also schedule an earlier appointment if necessary.  Additionally, you may be receiving a survey about your experience at our office within a few days to 1 week by e-mail or mail. We value your feedback.  Marsa Officer, DO [facility], [place]

## 2024-03-10 NOTE — Progress Notes (Signed)
 Subjective:    Patient ID: Ryan Bean, male    DOB: 1966-04-02, 58 y.o.   MRN: [id number]  Ryan Bean is a 58 y.o. male presenting on 03/10/2024 for Hand Pain (Left about 2 months)  Patient presents for a same day appointment.   HPI  Discussed the use of AI scribe software for clinical note transcription with the patient, who gave verbal consent to proceed.  History of Present Illness   Ryan Bean is a 58 year old male who presents with left hand swelling and limited finger movement.  Left hand swelling and deformity - Swelling in the left hand present for approximately two months - Swelling began after a previous episode of elbow swelling - No recent trauma to the hand - History of prior injury involving Left palm years prior, laceration from accidental injury, had some soft tissue loss at this time but he states that this healed and he has not had this problem since.  Impaired left hand mobility - Unable to move the four fingers of the left hand for approximately two months - Able to move the left thumb - Feels something in the hand when attempting to bend fingers  Left hand pain - Persistent pain in the left hand - Pain disrupts sleep - Pain present during the visit  Symptom management - Uses Aleve for swelling and pain relief - Uses a topical pack from work, which provides temporary relief - Has not tried Voltaren gel  Essential Hypertension Elevated BP today. Needs refills on medications - Amlodipine  10mg  daily, hydrochlorothiazide  25mg  daily, Losartan  50mg  daily      08/27/2023   10:45 AM 08/12/2023   10:17 AM 02/18/2023   10:09 AM  Depression screen PHQ 2/9  Decreased Interest 1 1 3   Down, Depressed, Hopeless 0 0 0  PHQ - 2 Score 1 1 3   Altered sleeping 0 1 0  Tired, decreased energy 0 1 0  Change in appetite 0 1 0  Feeling bad or failure about yourself  0 0 0  Trouble concentrating 0 0 0  Moving slowly or  fidgety/restless 0 0 0  Suicidal thoughts 0 0 0  PHQ-9 Score 1  4  3    Difficult doing work/chores Not difficult at all Not difficult at all Extremely dIfficult     Data saved with a previous flowsheet row definition       08/27/2023   10:46 AM 08/12/2023   10:17 AM 02/18/2023   10:09 AM 06/25/2022    1:43 PM  GAD 7 : Generalized Anxiety Score  Nervous, Anxious, on Edge 0 0 0 1  Control/stop worrying 0 0 0 1  Worry too much - different things 0 0 0 1  Trouble relaxing 0 0 0 1  Restless 0 0 0 1  Easily annoyed or irritable 0 0 0 1  Afraid - awful might happen 0 0 0 0  Total GAD 7 Score 0 0 0 6  Anxiety Difficulty Not difficult at all Not difficult at all  Not difficult at all    Social History   Tobacco Use   Smoking status: Every Day    Current packs/day: 0.25    Average packs/day: 0.3 packs/day for 30.0 years (7.5 ttl pk-yrs)    Types: Cigarettes   Smokeless tobacco: Current  Substance Use Topics   Alcohol use: Yes    Alcohol/week: 2.0 standard drinks of alcohol    Types: 2 Cans of beer per week  Comment: occasional   Drug use: No    Review of Systems Per HPI unless specifically indicated above     Objective:    BP (!) 136/92 (BP Location: Left Arm, Cuff Size: Normal)   Pulse (!) 101   Ht 5' 8 (1.727 m)   Wt 184 lb 6 oz (83.6 kg)   SpO2 95%   BMI 28.03 kg/m   Wt Readings from Last 3 Encounters:  03/10/24 184 lb 6 oz (83.6 kg)  08/27/23 185 lb 6 oz (84.1 kg)  08/12/23 194 lb 12.8 oz (88.4 kg)    Physical Exam Vitals and nursing note reviewed.  Constitutional:      General: He is not in acute distress.    Appearance: Normal appearance. He is well-developed. He is not diaphoretic.     Comments: Well-appearing, comfortable, cooperative  HENT:     Head: Normocephalic and atraumatic.  Eyes:     General:        Right eye: No discharge.        Left eye: No discharge.     Conjunctiva/sclera: Conjunctivae normal.  Cardiovascular:     Rate and Rhythm:  Normal rate.  Pulmonary:     Effort: Pulmonary effort is normal.  Musculoskeletal:     Comments: Left Hand Index through 5th finger limited mobility, not rigid but has difficulty with finger separation and flex extension. Palpable large firm nodular density within palm consistent with dupuytren contracture, some mild edema generalized but non focal, no erythema. No point bony tenderness.  Skin:    General: Skin is warm and dry.     Findings: No erythema or rash.  Neurological:     Mental Status: He is alert and oriented to person, place, and time.  Psychiatric:        Mood and Affect: Mood normal.        Behavior: Behavior normal.        Thought Content: Thought content normal.     Comments: Well groomed, good eye contact, normal speech and thoughts     I have personally reviewed the radiology report from Left Hand X-ray on 03/10/24.  CLINICAL DATA:  Left hand pain and decreased mobility.   EXAM: LEFT HAND - COMPLETE 3+ VIEW   COMPARISON:  None Available.   FINDINGS: No acute fracture or dislocation. The bones are osteopenic. Mild arthritic changes of the interphalangeal joints and joint space narrowing. The soft tissues are unremarkable   IMPRESSION: 1. No acute fracture or dislocation. 2. Mild arthritic changes.     Electronically Signed   By: Vanetta Chou M.D.   On: 03/10/2024 12:06  Results for orders placed or performed in visit on 08/20/23  Comprehensive metabolic panel with GFR   Collection Time: 08/20/23  8:55 AM  Result Value Ref Range   Glucose, Bld 110 (H) 65 - 99 mg/dL   BUN 8 7 - 25 mg/dL   Creat 8.96 9.29 - 8.69 mg/dL   eGFR 84 > OR = 60 fO/fpw/8.26f7   BUN/Creatinine Ratio SEE NOTE: 6 - 22 (calc)   Sodium 137 135 - 146 mmol/L   Potassium 3.6 3.5 - 5.3 mmol/L   Chloride 102 98 - 110 mmol/L   CO2 22 20 - 32 mmol/L   Calcium  9.3 8.6 - 10.3 mg/dL   Total Protein 7.8 6.1 - 8.1 g/dL   Albumin 5.0 3.6 - 5.1 g/dL   Globulin 2.8 1.9 - 3.7 g/dL  (calc)   AG Ratio 1.8 1.0 -  2.5 (calc)   Total Bilirubin 0.9 0.2 - 1.2 mg/dL   Alkaline phosphatase (APISO) 141 35 - 144 U/L   AST 257 (H) 10 - 35 U/L   ALT 140 (H) 9 - 46 U/L  PSA   Collection Time: 08/20/23  8:55 AM  Result Value Ref Range   PSA 1.07 < OR = 4.00 ng/mL  CBC with Differential/Platelet   Collection Time: 08/20/23  8:55 AM  Result Value Ref Range   WBC 4.6 3.8 - 10.8 Thousand/uL   RBC 4.85 4.20 - 5.80 Million/uL   Hemoglobin 15.6 13.2 - 17.1 g/dL   HCT 54.8 61.4 - 49.9 %   MCV 93.0 80.0 - 100.0 fL   MCH 32.2 27.0 - 33.0 pg   MCHC 34.6 32.0 - 36.0 g/dL   RDW 84.9 88.9 - 84.9 %   Platelets 212 140 - 400 Thousand/uL   MPV 10.2 7.5 - 12.5 fL   Neutro Abs 1,812 1,500 - 7,800 cells/uL   Absolute Lymphocytes 2,337 850 - 3,900 cells/uL   Absolute Monocytes 359 200 - 950 cells/uL   Eosinophils Absolute 32 15 - 500 cells/uL   Basophils Absolute 60 0 - 200 cells/uL   Neutrophils Relative % 39.4 %   Total Lymphocyte 50.8 %   Monocytes Relative 7.8 %   Eosinophils Relative 0.7 %   Basophils Relative 1.3 %  Hemoglobin A1c   Collection Time: 08/20/23  8:55 AM  Result Value Ref Range   Hgb A1c MFr Bld 5.5 <5.7 %   Mean Plasma Glucose 111 mg/dL   eAG (mmol/L) 6.2 mmol/L  Lipid panel   Collection Time: 08/20/23  8:55 AM  Result Value Ref Range   Cholesterol 206 (H) <200 mg/dL   HDL 55 > OR = 40 mg/dL   Triglycerides 011 (H) <150 mg/dL   LDL Cholesterol (Calc)  mg/dL (calc)   Total CHOL/HDL Ratio 3.7 <5.0 (calc)   Non-HDL Cholesterol (Calc) 151 (H) <130 mg/dL (calc)      Assessment & Plan:   Problem List Items Addressed This Visit     Essential hypertension   Relevant Medications   amLODipine  (NORVASC ) 10 MG tablet   hydrochlorothiazide  (HYDRODIURIL ) 25 MG tablet   losartan  (COZAAR ) 50 MG tablet   Other Visit Diagnoses       Left hand pain    -  Primary   Relevant Medications   naproxen  (NAPROSYN ) 500 MG tablet   Other Relevant Orders   DG Hand Complete  Left (Completed)   Ambulatory referral to Orthopedic Surgery     Dupuytren contracture of left hand       Relevant Medications   naproxen  (NAPROSYN ) 500 MG tablet   Other Relevant Orders   DG Hand Complete Left (Completed)   Ambulatory referral to Orthopedic Surgery     Flexion deformity, left finger joints       Relevant Orders   DG Hand Complete Left (Completed)   Ambulatory referral to Orthopedic Surgery        Dupuytren's contracture and flexion contracture of left hand with pain and possible nerve dysfunction Chronic left hand swelling and limited range of motion for two months, with inability to flex four fingers. Possible Dupuytren's contracture with nodules and scar tissue formation in palm, with history of prior injury years ago.  Differential includes arthritis and nerve dysfunction. Exam is not consistent with gout or trauma. Pain managed with Aleve  (naproxen ) and topical analgesics.  - Ordered left hand x-ray STAT - results  are in with mild osteoarthritis, no acute abnormality - Referred to [facility] for hand specialist evaluation - [place]. - Prescribed prescription-strength naproxen, to be taken with meals twice daily for one to two weeks. - Recommended over-the-counter Voltaren gel, to be applied up to four times daily.  Essential hypertension Blood pressure management ongoing with current medication regimen. - Refilled blood pressure medications. Amlodipine  10mg  daily, hydrochlorothiazide  25mg  daily, Losartan  50mg  daily       Orders Placed This Encounter  Procedures   DG Hand Complete Left    Standing Status:   Future    Number of Occurrences:   1    Expiration Date:   03/10/2025    Reason for Exam (SYMPTOM  OR DIAGNOSIS REQUIRED):   >2 months reduced flexion/extension mobility of fingers, hand pain swelling eval arthritis    Preferred imaging location?:   [facility]   Ambulatory referral to Orthopedic Surgery    Referral Priority:   Routine     Referral Type:   Surgical    Referral Reason:   Specialty Services Required    Requested Specialty:   Orthopedic Surgery    Number of Visits Requested:   1    Meds ordered this encounter  Medications   naproxen (NAPROSYN) 500 MG tablet    Sig: Take 1 tablet (500 mg total) by mouth 2 (two) times daily with a meal. For 1-2 weeks then as needed    Dispense:  60 tablet    Refill:  0   amLODipine  (NORVASC ) 10 MG tablet    Sig: Take 1 tablet (10 mg total) by mouth daily.    Dispense:  90 tablet    Refill:  1   hydrochlorothiazide  (HYDRODIURIL ) 25 MG tablet    Sig: Take 1 tablet (25 mg total) by mouth daily.    Dispense:  90 tablet    Refill:  1   losartan  (COZAAR ) 50 MG tablet    Sig: Take 1 tablet (50 mg total) by mouth daily.    Dispense:  90 tablet    Refill:  1    Follow up plan: Return for 5 month fasting lab > 1 week later Annual Physical.  Future labs ordered for 08/03/24   Marsa Officer, DO [facility] 03/10/2024, 10:46 AM

## 2024-03-15 DIAGNOSIS — M720 Palmar fascial fibromatosis [Dupuytren]: Secondary | ICD-10-CM | POA: Diagnosis not present

## 2024-03-15 DIAGNOSIS — M25542 Pain in joints of left hand: Secondary | ICD-10-CM | POA: Diagnosis not present

## 2024-04-06 ENCOUNTER — Telehealth: Payer: Self-pay | Admitting: Family Medicine

## 2024-04-06 NOTE — Telephone Encounter (Signed)
 Pt is requesting a letter to return to  work on 04-09-24

## 2024-04-06 NOTE — Telephone Encounter (Signed)
 Spoke with patient explained to patient we can not provide a work note he will have to contact [facility]. He insisted that I text him the phone number for [facility]. I tried to explain to him that I can not text him but I can give him  the number to write down. He hung up the phone at this time.

## 2024-04-06 NOTE — Telephone Encounter (Signed)
 Spoke to patient, I asked multiple times which provider took him out of work, he stated short term disability took him out of work. Can we write a note for patient to return to work.

## 2024-04-06 NOTE — Telephone Encounter (Signed)
 In November, I referred him over to [employer]. That doctor will need to clear him to return him to work. I cannot do this.  Marsa Officer, DO [facility] 04/06/2024, 12:44 PM

## 2024-04-07 ENCOUNTER — Other Ambulatory Visit: Payer: Self-pay | Admitting: Family Medicine

## 2024-04-07 DIAGNOSIS — M720 Palmar fascial fibromatosis [Dupuytren]: Secondary | ICD-10-CM

## 2024-04-07 DIAGNOSIS — M79642 Pain in left hand: Secondary | ICD-10-CM

## 2024-04-09 NOTE — Telephone Encounter (Signed)
 Requested Prescriptions  Pending Prescriptions Disp Refills   naproxen  (NAPROSYN ) 500 MG tablet [Pharmacy Med Name: NAPROXEN  500 MG TABLET] 60 tablet 0    Sig: TAKE 1 TABLET (500 MG TOTAL) BY MOUTH 2 (TWO) TIMES DAILY WITH A MEAL. FOR 1-2 WEEKS THEN AS NEEDED     Analgesics:  NSAIDS Failed - 04/09/2024  1:21 PM      Failed - Manual Review: Labs are only required if the patient has taken medication for more than 8 weeks.      Passed - Cr in normal range and within 360 days    Creat  Date Value Ref Range Status  08/20/2023 1.03 0.70 - 1.30 mg/dL Final         Passed - HGB in normal range and within 360 days    Hemoglobin  Date Value Ref Range Status  08/20/2023 15.6 13.2 - 17.1 g/dL Final         Passed - PLT in normal range and within 360 days    Platelets  Date Value Ref Range Status  08/20/2023 212 140 - 400 Thousand/uL Final         Passed - HCT in normal range and within 360 days    HCT  Date Value Ref Range Status  08/20/2023 45.1 38.5 - 50.0 % Final         Passed - eGFR is 30 or above and within 360 days    GFR, Est African American  Date Value Ref Range Status  06/08/2020 97 > OR = 60 mL/min/1.61m2 Final   GFR, Est Non African American  Date Value Ref Range Status  06/08/2020 83 > OR = 60 mL/min/1.56m2 Final   GFR, Estimated  Date Value Ref Range Status  02/11/2022 >60 >60 mL/min Final    Comment:    (NOTE) Calculated using the CKD-EPI Creatinine Equation (2021)    eGFR  Date Value Ref Range Status  08/20/2023 84 > OR = 60 mL/min/1.55m2 Final         Passed - Patient is not pregnant      Passed - Valid encounter within last 12 months    Recent Outpatient Visits           1 month ago Left hand pain   [place] [facility] [place] Marsa PARAS, DO   7 months ago Right elbow pain   [facility] [place], Marsa PARAS, DO   8 months ago Annual physical exam   [facility]  [place], Marsa PARAS, [place]

## 2024-08-03 ENCOUNTER — Other Ambulatory Visit

## 2024-08-10 ENCOUNTER — Encounter: Admitting: Family Medicine
